# Patient Record
Sex: Female | Born: 1985 | Race: White | Hispanic: No | Smoking: Never smoker
Health system: Southern US, Community
[De-identification: ages and names within clinical notes are randomized; demographics above are authoritative.]

## PROBLEM LIST (undated history)

## (undated) DIAGNOSIS — J45909 Unspecified asthma, uncomplicated: Secondary | ICD-10-CM

## (undated) DIAGNOSIS — T7840XA Allergy, unspecified, initial encounter: Secondary | ICD-10-CM

## (undated) DIAGNOSIS — F329 Major depressive disorder, single episode, unspecified: Secondary | ICD-10-CM

## (undated) DIAGNOSIS — K219 Gastro-esophageal reflux disease without esophagitis: Secondary | ICD-10-CM

## (undated) DIAGNOSIS — F32A Depression, unspecified: Secondary | ICD-10-CM

## (undated) DIAGNOSIS — F419 Anxiety disorder, unspecified: Secondary | ICD-10-CM

## (undated) DIAGNOSIS — G43909 Migraine, unspecified, not intractable, without status migrainosus: Secondary | ICD-10-CM

## (undated) DIAGNOSIS — K5190 Ulcerative colitis, unspecified, without complications: Secondary | ICD-10-CM

## (undated) HISTORY — DX: Unspecified asthma, uncomplicated: J45.909

## (undated) HISTORY — DX: Ulcerative colitis, unspecified, without complications: K51.90

## (undated) HISTORY — PX: PARTIAL COLECTOMY: SHX5273

## (undated) HISTORY — DX: Allergy, unspecified, initial encounter: T78.40XA

## (undated) HISTORY — DX: Depression, unspecified: F32.A

## (undated) HISTORY — DX: Gastro-esophageal reflux disease without esophagitis: K21.9

## (undated) HISTORY — DX: Major depressive disorder, single episode, unspecified: F32.9

## (undated) HISTORY — DX: Anxiety disorder, unspecified: F41.9

## (undated) HISTORY — DX: Migraine, unspecified, not intractable, without status migrainosus: G43.909

---
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
## 2008-07-03 ENCOUNTER — Emergency Department (HOSPITAL_COMMUNITY): Admission: EM | Admit: 2008-07-03 | Discharge: 2008-07-04 | Payer: Self-pay | Admitting: Emergency Medicine

## 2009-11-12 ENCOUNTER — Emergency Department (HOSPITAL_COMMUNITY): Admission: EM | Admit: 2009-11-12 | Discharge: 2009-11-12 | Payer: Self-pay | Admitting: Emergency Medicine

## 2011-01-08 LAB — BASIC METABOLIC PANEL
BUN: 14 mg/dL (ref 6–23)
CO2: 17 mEq/L — ABNORMAL LOW (ref 19–32)
Calcium: 9.7 mg/dL (ref 8.4–10.5)
Chloride: 104 mEq/L (ref 96–112)
Creatinine, Ser: 1.07 mg/dL (ref 0.4–1.2)
GFR calc Af Amer: >60 mL/min (ref >60)
GFR calc non Af Amer: >60 mL/min (ref >60)
Glucose, Bld: 138 mg/dL — ABNORMAL HIGH (ref 70–99)
Potassium: 4.7 mEq/L (ref 3.5–5.1)
Sodium: 134 mEq/L — ABNORMAL LOW (ref 135–145)

## 2011-01-08 LAB — URINALYSIS, ROUTINE W REFLEX MICROSCOPIC
Bilirubin Urine: NEGATIVE
Glucose, UA: NEGATIVE mg/dL
Leukocytes, UA: NEGATIVE
Nitrite: NEGATIVE
Protein, ur: 100 mg/dL — AB
Specific Gravity, Urine: 1.031 — ABNORMAL HIGH (ref 1.005–1.030)
Urobilinogen, UA: 0.2 mg/dL (ref 0.0–1.0)
pH: 6 (ref 5.0–8.0)

## 2011-01-08 LAB — URINE MICROSCOPIC-ADD ON

## 2011-01-08 LAB — DIFFERENTIAL
Basophils Absolute: 0 10*3/uL (ref 0.0–0.1)
Basophils Relative: 0 % (ref 0–1)
Eosinophils Absolute: 0.2 10*3/uL (ref 0.0–0.7)
Eosinophils Relative: 2 % (ref 0–5)
Lymphocytes Relative: 13 % (ref 12–46)
Lymphs Abs: 1.5 10*3/uL (ref 0.7–4.0)
Monocytes Absolute: 0.8 10*3/uL (ref 0.1–1.0)
Monocytes Relative: 6 % (ref 3–12)
Neutro Abs: 9.2 10*3/uL — ABNORMAL HIGH (ref 1.7–7.7)
Neutrophils Relative %: 79 % — ABNORMAL HIGH (ref 43–77)

## 2011-01-08 LAB — CBC
HCT: 51.1 % — ABNORMAL HIGH (ref 36.0–46.0)
Hemoglobin: 17.3 g/dL — ABNORMAL HIGH (ref 12.0–15.0)
MCHC: 33.9 g/dL (ref 30.0–36.0)
MCV: 94.6 fL (ref 78.0–100.0)
Platelets: 195 10*3/uL (ref 150–400)
RBC: 5.4 MIL/uL — ABNORMAL HIGH (ref 3.87–5.11)
RDW: 12.9 % (ref 11.5–15.5)
WBC: 11.7 10*3/uL — ABNORMAL HIGH (ref 4.0–10.5)

## 2011-01-08 LAB — POCT PREGNANCY, URINE: Preg Test, Ur: NEGATIVE

## 2011-07-26 LAB — URINALYSIS, ROUTINE W REFLEX MICROSCOPIC
Glucose, UA: NEGATIVE
Ketones, ur: >80 — AB
Leukocytes, UA: NEGATIVE
Nitrite: NEGATIVE
Protein, ur: NEGATIVE
Specific Gravity, Urine: 1.035 — ABNORMAL HIGH
Urobilinogen, UA: 0.2
pH: 5.5

## 2011-07-26 LAB — COMPREHENSIVE METABOLIC PANEL
ALT: 30
AST: 26
Albumin: 4.5
Alkaline Phosphatase: 97
BUN: 10
CO2: 25
Calcium: 9.8
Chloride: 103
Creatinine, Ser: 0.87
GFR calc Af Amer: >60
GFR calc non Af Amer: >60
Glucose, Bld: 83
Potassium: 4.1
Sodium: 138
Total Bilirubin: 1.8 — ABNORMAL HIGH
Total Protein: 8

## 2011-07-26 LAB — DIFFERENTIAL
Basophils Absolute: 0.1
Basophils Relative: 1
Eosinophils Absolute: 0
Eosinophils Relative: 1
Lymphocytes Relative: 19
Lymphs Abs: 1.7
Monocytes Absolute: 0.6
Monocytes Relative: 7
Neutro Abs: 6.6
Neutrophils Relative %: 74

## 2011-07-26 LAB — RPR: RPR Ser Ql: NONREACTIVE

## 2011-07-26 LAB — URINE MICROSCOPIC-ADD ON

## 2011-07-26 LAB — CBC
HCT: 39.7
Hemoglobin: 13.6
MCHC: 34.1
MCV: 93
Platelets: 243
RBC: 4.27
RDW: 12.4
WBC: 8.9

## 2011-07-26 LAB — WET PREP, GENITAL
Clue Cells Wet Prep HPF POC: NONE SEEN
Trich, Wet Prep: NONE SEEN
Yeast Wet Prep HPF POC: NONE SEEN

## 2011-07-26 LAB — POCT PREGNANCY, URINE: Preg Test, Ur: NEGATIVE

## 2011-07-26 LAB — GC/CHLAMYDIA PROBE AMP, GENITAL
Chlamydia, DNA Probe: NEGATIVE
GC Probe Amp, Genital: NEGATIVE

## 2016-02-27 DIAGNOSIS — H52223 Regular astigmatism, bilateral: Secondary | ICD-10-CM | POA: Diagnosis not present

## 2017-01-29 ENCOUNTER — Ambulatory Visit (INDEPENDENT_AMBULATORY_CARE_PROVIDER_SITE_OTHER): Payer: BLUE CROSS/BLUE SHIELD | Admitting: Licensed Clinical Social Worker

## 2017-01-29 DIAGNOSIS — F321 Major depressive disorder, single episode, moderate: Secondary | ICD-10-CM | POA: Diagnosis not present

## 2017-02-18 DIAGNOSIS — J209 Acute bronchitis, unspecified: Secondary | ICD-10-CM | POA: Diagnosis not present

## 2017-02-18 DIAGNOSIS — R05 Cough: Secondary | ICD-10-CM | POA: Diagnosis not present

## 2017-02-18 DIAGNOSIS — R0989 Other specified symptoms and signs involving the circulatory and respiratory systems: Secondary | ICD-10-CM | POA: Diagnosis not present

## 2017-04-03 DIAGNOSIS — J4521 Mild intermittent asthma with (acute) exacerbation: Secondary | ICD-10-CM | POA: Diagnosis not present

## 2017-04-03 DIAGNOSIS — F419 Anxiety disorder, unspecified: Secondary | ICD-10-CM | POA: Diagnosis not present

## 2017-04-19 ENCOUNTER — Encounter: Payer: Self-pay | Admitting: Internal Medicine

## 2017-04-20 ENCOUNTER — Encounter: Payer: Self-pay | Admitting: Internal Medicine

## 2017-04-20 ENCOUNTER — Other Ambulatory Visit (INDEPENDENT_AMBULATORY_CARE_PROVIDER_SITE_OTHER): Payer: BLUE CROSS/BLUE SHIELD

## 2017-04-20 ENCOUNTER — Ambulatory Visit (INDEPENDENT_AMBULATORY_CARE_PROVIDER_SITE_OTHER): Payer: BLUE CROSS/BLUE SHIELD | Admitting: Internal Medicine

## 2017-04-20 VITALS — BP 126/66 | HR 84 | Ht 64.0 in | Wt 209.2 lb

## 2017-04-20 DIAGNOSIS — J45991 Cough variant asthma: Secondary | ICD-10-CM | POA: Diagnosis not present

## 2017-04-20 LAB — CBC WITH DIFFERENTIAL/PLATELET
Basophils Absolute: 0.1 10*3/uL (ref 0.0–0.1)
Basophils Relative: 1 % (ref 0.0–3.0)
Eosinophils Absolute: 0.4 10*3/uL (ref 0.0–0.7)
Eosinophils Relative: 4.4 % (ref 0.0–5.0)
HCT: 42.3 % (ref 36.0–46.0)
Hemoglobin: 14.4 g/dL (ref 12.0–15.0)
Lymphocytes Relative: 37.8 % (ref 12.0–46.0)
Lymphs Abs: 3.8 10*3/uL (ref 0.7–4.0)
MCHC: 34 g/dL (ref 30.0–36.0)
MCV: 88.5 fl (ref 78.0–100.0)
Monocytes Absolute: 0.6 10*3/uL (ref 0.1–1.0)
Monocytes Relative: 5.7 % (ref 3.0–12.0)
Neutro Abs: 5.1 10*3/uL (ref 1.4–7.7)
Neutrophils Relative %: 51.1 % (ref 43.0–77.0)
Platelets: 254 10*3/uL (ref 150.0–400.0)
RBC: 4.78 Mil/uL (ref 3.87–5.11)
RDW: 13.2 % (ref 11.5–15.5)
WBC: 10 10*3/uL (ref 4.0–10.5)

## 2017-04-20 MED ORDER — MOMETASONE FURO-FORMOTEROL FUM 100-5 MCG/ACT IN AERO: 2.0000 | INHALATION_SPRAY | Freq: Two times a day (BID) | RESPIRATORY_TRACT | 0 refills | Status: DC

## 2017-04-20 MED ORDER — FAMOTIDINE 20 MG PO TABS: ORAL_TABLET | ORAL | 2 refills | Status: DC

## 2017-04-20 MED ORDER — MOMETASONE FURO-FORMOTEROL FUM 100-5 MCG/ACT IN AERO: 2.0000 | INHALATION_SPRAY | Freq: Two times a day (BID) | RESPIRATORY_TRACT | 11 refills | Status: DC

## 2017-04-20 MED ORDER — PANTOPRAZOLE SODIUM 40 MG PO TBEC: 40.0000 mg | DELAYED_RELEASE_TABLET | Freq: Every day | ORAL | 2 refills | Status: DC

## 2017-04-20 MED ORDER — PREDNISONE 10 MG PO TABS: ORAL_TABLET | ORAL | 0 refills | Status: DC

## 2017-04-20 NOTE — Patient Instructions (Addendum)
Please remember to go to the lab  department downstairs in the basement  for your tests - we will call you with the results when they are available.  Stop flovent and try dulera 100 Take 2 puffs first thing in am and then another 2 puffs about 12 hours later.   Work on inhaler technique:  relax and gently blow all the way out then take a nice smooth deep breath back in, triggering the inhaler at same time you start breathing in.  Hold for up to 5 seconds if you can. Blow out thru nose. Rinse and gargle with water when done       Prednisone 10 mg take  4 each am x 2 days,   2 each am x 2 days,  1 each am x 2 days and stop    Pantoprazole (protonix) 40 mg   Take  30-60 min before first meal of the day and Pepcid (famotidine)  20 mg one @  bedtime until the cough is gone for a week.   GERD (REFLUX)  is an extremely common cause of respiratory symptoms just like yours , many times with no obvious heartburn at all.    It can be treated with medication, but also with lifestyle changes including elevation of the head of your bed (ideally with 6 inch  bed blocks),  Smoking cessation, avoidance of late meals, excessive alcohol, and avoid fatty foods, chocolate, peppermint, colas, red wine, and acidic juices such as orange juice.  NO MINT OR MENTHOL PRODUCTS SO NO COUGH DROPS   USE SUGARLESS CANDY INSTEAD (Jolley ranchers or Stover's or Life Savers) or even ice chips will also do - the key is to swallow to prevent all throat clearing. NO OIL BASED VITAMINS - use powdered substitutes.   Please schedule a follow up office visit in 6 weeks, call sooner if needed

## 2017-04-20 NOTE — Progress Notes (Signed)
Subjective:     Patient ID: Melissa Moon, female   DOB: 12/29/1985,    MRN: [id number]  HPI  61 yowf never smoker grew up in [place] with seasonal rhinitis as child just with pollen exp assoc with cough/wheeze and used various inhalers / nebs trending better as adult  until Feb 2018 with onset a little rhinitis and a lot of cough much better on prednisone and worse off it despite trying singulair/ flovent/proair. Previously fine with IUP 2013 to term. referred to pulmonary clinic 04/20/2017 by Marda Stalker, [place]   04/20/2017 1st [facility] office visit/ Mazikeen Hehn   Chief Complaint  Patient presents with  . Advice Only    Pt was referred by Marda Stalker from [facility] due to having a bad cough since February with mucus. Pt has tried using her proair and flovent inhalers, Z-Pak, prednisone, and singulair and nothing is helping. Pt also has some wheezing and SOB associated with cough.  acute onset cough > rhinitis since feb 2018 worse after supper and hs s excess/ purulent sputum or mucus plugs  Assoc with subj wheezing and sob with adls and only better p pred but worse again w/nin a week.   Onset was acute in Feb 2018 with early onset of pollen exp vs prev years   No obvious day to day or daytime variability or assoc   hemoptysis or cp or chest tightness,  or overt  hb symptoms. No unusual exp hx or h/o childhood pna/   or knowledge of premature birth.   Also denies any obvious fluctuation of symptoms with weather or environmental changes or other aggravating or alleviating factors except as outlined above   Current Medications, Allergies, Complete Past Medical History, Past Surgical History, Family History, and Social History were reviewed in [employer] record.  ROS  The following are not active complaints unless bolded sore throat, dysphagia, dental problems, itching, sneezing,  nasal congestion or excess/ purulent secretions, ear ache,   fever, chills,  sweats, unintended wt loss, classically pleuritic or exertional cp,  orthopnea pnd or leg swelling, presyncope, palpitations, abdominal pain, anorexia, nausea, vomiting, diarrhea  or change in bowel or bladder habits, change in stools or urine, dysuria,hematuria,  rash, arthralgias, visual complaints, headache, numbness, weakness or ataxia or problems with walking or coordination,  change in mood/affect or memory.           Review of Systems     Objective:   Physical Exam    amb pleasant mod  obese wf nad    Wt Readings from Last 3 Encounters:  04/20/17 209 lb 3.2 oz (94.9 kg)    Vital signs reviewed  - Note on arrival 02 sats  94% RA    HEENT: nl dentition,   and oropharynx. Nl external ear canals without cough reflex -  Moderately severe  bilateral non-specific turbinate edema     NECK :  without JVD/Nodes/TM/ nl carotid upstrokes bilaterally   LUNGS: no acc muscle use,  Nl contour chest which is clear to A and P bilaterally without cough on insp or exp maneuvers   CV:  RRR  no s3 or murmur or increase in P2, and no edema   ABD:  soft and nontender with nl inspiratory excursion in the supine position. No bruits or organomegaly appreciated, bowel sounds nl  MS:  Nl gait/ ext warm without deformities, calf tenderness, cyanosis or clubbing No obvious joint restrictions   SKIN: warm and dry without lesions  NEURO:  alert, approp, nl sensorium with  no motor or cerebellar deficits apparent.    I personally reviewed images and agree with radiology impression as follows:  CXR:   02/18/17  wnl     Assessment:

## 2017-04-21 NOTE — Assessment & Plan Note (Addendum)
DDX of  difficult airways management almost all start with A and  include Adherence, Ace Inhibitors, Acid Reflux, Active Sinus Disease, Alpha 1 Antitripsin deficiency, Anxiety masquerading as Airways dz,  ABPA,  Allergy(esp in young), Aspiration (esp in elderly), Adverse effects of meds,  Active smokers, A bunch of PE's (a small clot burden can't cause this syndrome unless there is already severe underlying pulm or vascular dz with poor reserve) plus two Bs  = Bronchiectasis and Beta blocker use..and one C= CHF   Adherence is always the initial "prime suspect" and is a multilayered concern that requires a "trust but verify" approach in every patient - starting with knowing how to use medications, especially inhalers, correctly, keeping up with refills and understanding the fundamental difference between maintenance and prns vs those medications only taken for a very short course and then stopped and not refilled.  - - The proper method of use, as well as anticipated side effects, of a metered-dose inhaler are discussed and demonstrated to the patient. Improved effectiveness after extensive coaching during this visit to a level of approximately 75 % from a baseline of 25 % > try dulera 100 2bid   ? Acid (or non-acid) GERD > always difficult to exclude as up to 75% of pts in some series report no assoc GI/ Heartburn symptoms> rec max (24h)  acid suppression and diet restrictions/ reviewed and instructions given in writing.   ? Allergy >  Send profile, continue singulair and rx again with pred in  Lowest dose that works = 6 days  ? Active sinus dz > consider sinus ct later  ? ABPA ? > send IgE level   ? Anxiety > usually at the bottom of this list of usual suspects but should be somwwhat higher on this pt's based on H and P and note already on psychotropics  > continue zoloft per pcp   ? Bronchiectasis > cough is not typical for bronchiectasis   ? Beta blocker effect > n/a   ? Chf > no evidence at  all    Total time devoted to counseling  > 50 % of initial 45 min office visit:  review case with pt/ discussion of options/alternatives/ personally creating written customized instructions  in presence of pt  then going over those specific  Instructions directly with the pt including how to use all of the meds but in particular covering each new medication in detail and the difference between the maintenance= "automatic" meds and the prns using an action plan format for the latter (If this problem/symptom => do that organization reading Left to right).  Please see AVS from this visit for a full list of these instructions which I personally wrote for this pt and  are unique to this visit.

## 2017-04-21 NOTE — Assessment & Plan Note (Signed)
Body mass index is 35.91 kg/m.   No results found for: TSH   Contributing to gerd risk/ doe/reviewed the need and the process to achieve and maintain neg calorie balance > defer f/u primary care including intermittently monitoring thyroid status

## 2017-04-21 NOTE — Addendum Note (Signed)
Addended by: Christinia Gully B on: 04/21/2017 06:01 AM   Modules accepted: Level of Service

## 2017-04-23 LAB — RESPIRATORY ALLERGY PROFILE REGION II ~~LOC~~
Allergen, A. alternata, m6: <0.1 kU/L
Allergen, C. Herbarum, M2: <0.1 kU/L
Allergen, Cedar tree, t12: <0.1 kU/L
Allergen, Comm Silver Birch, t9: 1.32 kU/L — ABNORMAL HIGH
Allergen, Cottonwood, t14: <0.1 kU/L
Allergen, D pternoyssinus,d7: <0.1 kU/L
Allergen, Mouse Urine Protein, e78: <0.1 kU/L
Allergen, Mulberry, t76: <0.1 kU/L
Allergen, Oak,t7: 0.28 kU/L — ABNORMAL HIGH
Allergen, P. notatum, m1: <0.1 kU/L
Aspergillus fumigatus, m3: <0.1 kU/L
Bermuda Grass: 0.3 kU/L — ABNORMAL HIGH
Box Elder IgE: <0.1 kU/L
Cat Dander: <0.1 kU/L
Cockroach: <0.1 kU/L
Common Ragweed: 3.14 kU/L — ABNORMAL HIGH
D. farinae: <0.1 kU/L
Dog Dander: <0.1 kU/L
Elm IgE: 0.5 kU/L — ABNORMAL HIGH
IgE (Immunoglobulin E), Serum: 46 kU/L (ref <115)
Johnson Grass: 0.29 kU/L — ABNORMAL HIGH
Pecan/Hickory Tree IgE: 6.8 kU/L — ABNORMAL HIGH
Rough Pigweed  IgE: 0.15 kU/L — ABNORMAL HIGH
Sheep Sorrel IgE: <0.1 kU/L
Timothy Grass: 1.09 kU/L — ABNORMAL HIGH

## 2017-04-24 NOTE — Progress Notes (Signed)
ATC, NA and no option to leave msg 

## 2017-05-24 ENCOUNTER — Encounter: Payer: Self-pay | Admitting: Internal Medicine

## 2017-06-01 ENCOUNTER — Ambulatory Visit: Payer: Self-pay | Admitting: Internal Medicine

## 2018-01-06 DIAGNOSIS — J45901 Unspecified asthma with (acute) exacerbation: Secondary | ICD-10-CM | POA: Diagnosis not present

## 2018-01-06 DIAGNOSIS — Z8709 Personal history of other diseases of the respiratory system: Secondary | ICD-10-CM | POA: Diagnosis not present

## 2018-01-06 DIAGNOSIS — J209 Acute bronchitis, unspecified: Secondary | ICD-10-CM | POA: Diagnosis not present

## 2018-01-06 DIAGNOSIS — R062 Wheezing: Secondary | ICD-10-CM | POA: Diagnosis not present

## 2018-01-20 DIAGNOSIS — J4521 Mild intermittent asthma with (acute) exacerbation: Secondary | ICD-10-CM | POA: Diagnosis not present

## 2018-01-20 DIAGNOSIS — Z8709 Personal history of other diseases of the respiratory system: Secondary | ICD-10-CM | POA: Diagnosis not present

## 2018-01-20 DIAGNOSIS — Z889 Allergy status to unspecified drugs, medicaments and biological substances status: Secondary | ICD-10-CM | POA: Diagnosis not present

## 2018-02-14 DIAGNOSIS — M5412 Radiculopathy, cervical region: Secondary | ICD-10-CM | POA: Diagnosis not present

## 2018-02-14 DIAGNOSIS — M530 Cervicocranial syndrome: Secondary | ICD-10-CM | POA: Diagnosis not present

## 2018-02-14 DIAGNOSIS — M531 Cervicobrachial syndrome: Secondary | ICD-10-CM | POA: Diagnosis not present

## 2018-02-14 DIAGNOSIS — M9901 Segmental and somatic dysfunction of cervical region: Secondary | ICD-10-CM | POA: Diagnosis not present

## 2018-02-25 ENCOUNTER — Observation Stay (HOSPITAL_COMMUNITY)
Admission: EM | Admit: 2018-02-25 | Discharge: 2018-02-26 | Disposition: A | Discharge status: Home / Self Care | Payer: BLUE CROSS/BLUE SHIELD | Attending: Internal Medicine | Admitting: Internal Medicine

## 2018-02-25 ENCOUNTER — Emergency Department (HOSPITAL_COMMUNITY): Payer: BLUE CROSS/BLUE SHIELD

## 2018-02-25 ENCOUNTER — Encounter (HOSPITAL_COMMUNITY): Payer: Self-pay | Admitting: *Deleted

## 2018-02-25 ENCOUNTER — Other Ambulatory Visit: Payer: Self-pay

## 2018-02-25 DIAGNOSIS — K5190 Ulcerative colitis, unspecified, without complications: Secondary | ICD-10-CM | POA: Diagnosis not present

## 2018-02-25 DIAGNOSIS — Z8379 Family history of other diseases of the digestive system: Secondary | ICD-10-CM | POA: Insufficient documentation

## 2018-02-25 DIAGNOSIS — Z800 Family history of malignant neoplasm of digestive organs: Secondary | ICD-10-CM | POA: Diagnosis not present

## 2018-02-25 DIAGNOSIS — J4531 Mild persistent asthma with (acute) exacerbation: Secondary | ICD-10-CM | POA: Diagnosis not present

## 2018-02-25 DIAGNOSIS — Z6834 Body mass index (BMI) 34.0-34.9, adult: Secondary | ICD-10-CM | POA: Diagnosis not present

## 2018-02-25 DIAGNOSIS — D72829 Elevated white blood cell count, unspecified: Secondary | ICD-10-CM | POA: Diagnosis not present

## 2018-02-25 DIAGNOSIS — J4550 Severe persistent asthma, uncomplicated: Secondary | ICD-10-CM

## 2018-02-25 DIAGNOSIS — J4521 Mild intermittent asthma with (acute) exacerbation: Secondary | ICD-10-CM

## 2018-02-25 DIAGNOSIS — F329 Major depressive disorder, single episode, unspecified: Secondary | ICD-10-CM | POA: Diagnosis not present

## 2018-02-25 DIAGNOSIS — R000 Tachycardia, unspecified: Secondary | ICD-10-CM | POA: Insufficient documentation

## 2018-02-25 DIAGNOSIS — Z79899 Other long term (current) drug therapy: Secondary | ICD-10-CM | POA: Insufficient documentation

## 2018-02-25 DIAGNOSIS — F419 Anxiety disorder, unspecified: Secondary | ICD-10-CM | POA: Insufficient documentation

## 2018-02-25 DIAGNOSIS — J45901 Unspecified asthma with (acute) exacerbation: Principal | ICD-10-CM | POA: Insufficient documentation

## 2018-02-25 DIAGNOSIS — R0602 Shortness of breath: Secondary | ICD-10-CM | POA: Diagnosis present

## 2018-02-25 DIAGNOSIS — Z833 Family history of diabetes mellitus: Secondary | ICD-10-CM | POA: Diagnosis not present

## 2018-02-25 DIAGNOSIS — Z825 Family history of asthma and other chronic lower respiratory diseases: Secondary | ICD-10-CM | POA: Diagnosis not present

## 2018-02-25 DIAGNOSIS — Z7951 Long term (current) use of inhaled steroids: Secondary | ICD-10-CM | POA: Insufficient documentation

## 2018-02-25 LAB — CBC WITH DIFFERENTIAL/PLATELET
Basophils Absolute: 0.1 10*3/uL (ref 0.0–0.1)
Basophils Relative: 1 %
Eosinophils Absolute: 1.1 10*3/uL — ABNORMAL HIGH (ref 0.0–0.7)
Eosinophils Relative: 9 %
HCT: 45.3 % (ref 36.0–46.0)
Hemoglobin: 15.4 g/dL — ABNORMAL HIGH (ref 12.0–15.0)
Lymphocytes Relative: 33 %
Lymphs Abs: 4.1 10*3/uL — ABNORMAL HIGH (ref 0.7–4.0)
MCH: 30.3 pg (ref 26.0–34.0)
MCHC: 34 g/dL (ref 30.0–36.0)
MCV: 89.2 fL (ref 78.0–100.0)
Monocytes Absolute: 0.6 10*3/uL (ref 0.1–1.0)
Monocytes Relative: 5 %
Neutro Abs: 6.6 10*3/uL (ref 1.7–7.7)
Neutrophils Relative %: 52 %
Platelets: 305 10*3/uL (ref 150–400)
RBC: 5.08 MIL/uL (ref 3.87–5.11)
RDW: 12.6 % (ref 11.5–15.5)
WBC: 12.5 10*3/uL — ABNORMAL HIGH (ref 4.0–10.5)

## 2018-02-25 LAB — I-STAT CHEM 8, ED
BUN: 7 mg/dL (ref 6–20)
Calcium, Ion: 0.93 mmol/L — ABNORMAL LOW (ref 1.15–1.40)
Chloride: 107 mmol/L (ref 101–111)
Creatinine, Ser: 0.8 mg/dL (ref 0.44–1.00)
Glucose, Bld: 178 mg/dL — ABNORMAL HIGH (ref 65–99)
HCT: 47 % — ABNORMAL HIGH (ref 36.0–46.0)
Hemoglobin: 16 g/dL — ABNORMAL HIGH (ref 12.0–15.0)
Potassium: 4.1 mmol/L (ref 3.5–5.1)
Sodium: 136 mmol/L (ref 135–145)
TCO2: 22 mmol/L (ref 22–32)

## 2018-02-25 LAB — CREATININE, SERUM
Creatinine, Ser: 1.07 mg/dL — ABNORMAL HIGH (ref 0.44–1.00)
GFR calc Af Amer: >60 mL/min (ref >60)
GFR calc non Af Amer: >60 mL/min (ref >60)

## 2018-02-25 LAB — CBC
HCT: 46 % (ref 36.0–46.0)
Hemoglobin: 15.4 g/dL — ABNORMAL HIGH (ref 12.0–15.0)
MCH: 30 pg (ref 26.0–34.0)
MCHC: 33.5 g/dL (ref 30.0–36.0)
MCV: 89.7 fL (ref 78.0–100.0)
Platelets: 308 10*3/uL (ref 150–400)
RBC: 5.13 MIL/uL — ABNORMAL HIGH (ref 3.87–5.11)
RDW: 12.8 % (ref 11.5–15.5)
WBC: 12.6 10*3/uL — ABNORMAL HIGH (ref 4.0–10.5)

## 2018-02-25 LAB — MAGNESIUM: Magnesium: 2.3 mg/dL (ref 1.7–2.4)

## 2018-02-25 LAB — I-STAT BETA HCG BLOOD, ED (MC, WL, AP ONLY): I-stat hCG, quantitative: 17.2 m[IU]/mL — ABNORMAL HIGH (ref <5)

## 2018-02-25 LAB — HCG, QUANTITATIVE, PREGNANCY: hCG, Beta Chain, Quant, S: <1 m[IU]/mL (ref <5)

## 2018-02-25 LAB — D-DIMER, QUANTITATIVE: D-Dimer, Quant: 0.3 ug/mL-FEU (ref 0.00–0.50)

## 2018-02-25 LAB — TSH: TSH: 0.383 u[IU]/mL (ref 0.350–4.500)

## 2018-02-25 MED ORDER — METHYLPREDNISOLONE SODIUM SUCC 125 MG IJ SOLR
125.0000 mg | Freq: Once | INTRAMUSCULAR | Status: AC
  Administered 2018-02-25: 125 mg via INTRAVENOUS
  Filled 2018-02-25: qty 2

## 2018-02-25 MED ORDER — LORATADINE 10 MG PO TABS
10.0000 mg | ORAL_TABLET | Freq: Once | ORAL | Status: AC
  Administered 2018-02-25: 10 mg via ORAL
  Filled 2018-02-25: qty 1

## 2018-02-25 MED ORDER — ALBUTEROL (5 MG/ML) CONTINUOUS INHALATION SOLN
10.0000 mg/h | INHALATION_SOLUTION | RESPIRATORY_TRACT | Status: DC
  Administered 2018-02-25: 10 mg/h via RESPIRATORY_TRACT
  Filled 2018-02-25: qty 20

## 2018-02-25 MED ORDER — METHYLPREDNISOLONE SODIUM SUCC 125 MG IJ SOLR
60.0000 mg | Freq: Three times a day (TID) | INTRAMUSCULAR | Status: DC
  Administered 2018-02-25 – 2018-02-26 (×4): 60 mg via INTRAVENOUS
  Filled 2018-02-25 (×4): qty 2

## 2018-02-25 MED ORDER — LEVALBUTEROL HCL 0.63 MG/3ML IN NEBU: 0.6300 mg | INHALATION_SOLUTION | Freq: Four times a day (QID) | RESPIRATORY_TRACT | Status: DC | PRN

## 2018-02-25 MED ORDER — FAMOTIDINE 20 MG PO TABS
10.0000 mg | ORAL_TABLET | Freq: Every day | ORAL | Status: DC
  Administered 2018-02-25 – 2018-02-26 (×2): 10 mg via ORAL
  Filled 2018-02-25 (×2): qty 1

## 2018-02-25 MED ORDER — ACETAMINOPHEN 325 MG PO TABS
650.0000 mg | ORAL_TABLET | Freq: Four times a day (QID) | ORAL | Status: DC | PRN
  Administered 2018-02-25 (×2): 650 mg via ORAL
  Filled 2018-02-25 (×2): qty 2

## 2018-02-25 MED ORDER — ENOXAPARIN SODIUM 40 MG/0.4ML ~~LOC~~ SOLN
40.0000 mg | SUBCUTANEOUS | Status: DC
  Administered 2018-02-25: 40 mg via SUBCUTANEOUS
  Filled 2018-02-25 (×2): qty 0.4

## 2018-02-25 MED ORDER — ALBUTEROL SULFATE (2.5 MG/3ML) 0.083% IN NEBU
5.0000 mg | INHALATION_SOLUTION | Freq: Once | RESPIRATORY_TRACT | Status: AC
  Administered 2018-02-25: 5 mg via RESPIRATORY_TRACT
  Filled 2018-02-25: qty 6

## 2018-02-25 MED ORDER — MOMETASONE FURO-FORMOTEROL FUM 100-5 MCG/ACT IN AERO
2.0000 | INHALATION_SPRAY | Freq: Two times a day (BID) | RESPIRATORY_TRACT | Status: DC
  Filled 2018-02-25: qty 8.8

## 2018-02-25 MED ORDER — ACETAMINOPHEN 650 MG RE SUPP: 650.0000 mg | Freq: Four times a day (QID) | RECTAL | Status: DC | PRN

## 2018-02-25 MED ORDER — GUAIFENESIN ER 600 MG PO TB12
600.0000 mg | ORAL_TABLET | Freq: Two times a day (BID) | ORAL | Status: DC
  Administered 2018-02-25 – 2018-02-26 (×3): 600 mg via ORAL
  Filled 2018-02-25 (×3): qty 1

## 2018-02-25 MED ORDER — MAGNESIUM SULFATE 2 GM/50ML IV SOLN
2.0000 g | Freq: Once | INTRAVENOUS | Status: AC
  Administered 2018-02-25: 2 g via INTRAVENOUS
  Filled 2018-02-25: qty 50

## 2018-02-25 MED ORDER — MOMETASONE FURO-FORMOTEROL FUM 100-5 MCG/ACT IN AERO
2.0000 | INHALATION_SPRAY | Freq: Two times a day (BID) | RESPIRATORY_TRACT | Status: DC
  Administered 2018-02-26: 2 via RESPIRATORY_TRACT
  Filled 2018-02-25: qty 8.8

## 2018-02-25 NOTE — H&P (Addendum)
[facility] History and Physical  Jaquisha Frech TKP:[id number] DOB: 1986/04/29 DOA: 02/25/2018  Referring physician:  PCP: Patient, No Pcp Per   Chief Complaint: shortness of breath   HPI:  32 year old female with a history of seasonal rhinitis,morbid obesity with a BMI of 35.9,asthma followed by Dr. Melvyn Novas, who presents to the ED today with gradual worsening  And difficulty breathing 3 months. She has experienced symptoms of wheezing, dyspnea on exertion, for the last 3-4 days, to the point that she could not catch a breath . She only uses albuterol and does not use a LABA ,or steroid inhaler . She is in process of finding a PCP. Marland KitchenShe has seen pulmonary but recently switched jobs and has not seen a provider recently . She was found to have  asthma exacerbation and was treated as following  ED course  BP (!) 145/85   Pulse (!) 116   Resp 20   Ht 5' 5"  (1.651 m)   LMP 02/04/2018 (Approximate)   SpO2 92%   BMI 34.81 kg/m  initally  89%on RA  chest x-ray shows central bronchitis markings, representing reactive airway disease Patient was treated with continuous nebs, IV Solu-Medrol 125 mg, IV magnesium, Claritin She is being admitted for asthma exacerbation.     Review of Systems: negative for the following  Constitutional: Denies fever, chills, diaphoresis, appetite change and fatigue.  HEENT: Denies photophobia, eye pain, redness, hearing loss, ear pain, congestion, sore throat, rhinorrhea, sneezing, mouth sores, trouble swallowing, neck pain, neck stiffness and tinnitus.  Respiratory: Positive for shortness of breath and wheezing. Negative for cough, hemoptysis and sputum production.   Cardiovascular: Denies chest pain, palpitations and leg swelling.  Gastrointestinal: Denies nausea, vomiting, abdominal pain, diarrhea, constipation, blood in stool and abdominal distention.  Genitourinary: Denies dysuria, urgency, frequency, hematuria, flank pain and difficulty urinating.   Musculoskeletal: Denies myalgias, back pain, joint swelling, arthralgias and gait problem.  Skin: Denies pallor, rash and wound.  Neurological: Denies dizziness, seizures, syncope, weakness, light-headedness, numbness and headaches.  Hematological: Denies adenopathy. Easy bruising, personal or family bleeding history  Psychiatric/Behavioral: Denies suicidal ideation, mood changes, confusion, nervousness, sleep disturbance and agitation       Past Medical History:  Diagnosis Date  . Anxiety and depression   . Asthma   . UC (ulcerative colitis) ([place])      History reviewed. No pertinent surgical history.    Social History:  reports that she has never smoked. She has never used smokeless tobacco. She reports that she does not drink alcohol or use drugs.    Allergies  Allergen Reactions  . Cefaclor Other (See Comments)    Happened as a child   . Tacrolimus Other (See Comments)    Happened as a child. unknown    Family History  Problem Relation Age of Onset  . Pancreatic cancer Mother        Deceased at age 76  . Diabetes Mother   . Liver disease Father   . COPD Father         Prior to Admission medications   Medication Sig Start Date End Date Taking? Authorizing Provider  albuterol (PROVENTIL HFA;VENTOLIN HFA) 108 (90 Base) MCG/ACT inhaler Inhale 2 puffs into the lungs every 6 (six) hours as needed for wheezing or shortness of breath.   Yes [provider]  pseudoephedrine (SUDAFED) 120 MG 12 hr tablet Take 120 mg by mouth every 12 (twelve) hours as needed for congestion.   Yes [provider]  famotidine (PEPCID) 20 MG tablet One at bedtime Patient not taking: Reported on 02/25/2018 04/20/17   Tanda Rockers, MD  mometasone-formoterol (DULERA) 100-5 MCG/ACT AERO Inhale 2 puffs into the lungs 2 (two) times daily. Patient not taking: Reported on 02/25/2018 04/20/17   Tanda Rockers, MD     Physical Exam: Vitals:   02/25/18 0415 02/25/18 0500 02/25/18  0600 02/25/18 0703  BP:  (!) 154/91 127/85 (!) 144/77  Pulse: (!) 120 (!) 107  (!) 135  Resp:    20  TempSrc:      SpO2: 93% (!) 89%  92%  Height:            Vitals:   02/25/18 0415 02/25/18 0500 02/25/18 0600 02/25/18 0703  BP:  (!) 154/91 127/85 (!) 144/77  Pulse: (!) 120 (!) 107  (!) 135  Resp:    20  TempSrc:      SpO2: 93% (!) 89%  92%  Height:       Constitutional: NAD, calm, comfortable Eyes: PERRL, lids and conjunctivae normal ENMT: Mucous membranes are moist. Posterior pharynx clear of any exudate or lesions.Normal dentition.  Neck: normal, supple, no masses, no thyromegaly Pulmonary/Chest: Tachypnea noted. She is in respiratory distress. She has wheezes.   Cardiovascular: Regular rate and rhythm, no murmurs / rubs / gallops. No extremity edema. 2+ pedal pulses. No carotid bruits.  Abdomen: no tenderness, no masses palpated. No hepatosplenomegaly. Bowel sounds positive.  Musculoskeletal: no clubbing / cyanosis. No joint deformity upper and lower extremities. Good ROM, no contractures. Normal muscle tone.  Skin: no rashes, lesions, ulcers. No induration Neurologic: CN 2-12 grossly intact. Sensation intact, DTR normal. Strength 5/5 in all 4.  Psychiatric: Normal judgment and insight. Alert and oriented x 3. Normal mood.     Labs on Admission: I have personally reviewed following labs and imaging studies  CBC: Recent Labs  Lab 02/25/18 0320 02/25/18 0359  WBC 12.5*  --   NEUTROABS 6.6  --   HGB 15.4* 16.0*  HCT 45.3 47.0*  MCV 89.2  --   PLT 305  --     Basic Metabolic Panel: Recent Labs  Lab 02/25/18 0359  NA 136  K 4.1  CL 107  GLUCOSE 178*  BUN 7  CREATININE 0.80    GFR: CrCl cannot be calculated (Unknown ideal weight.).  Liver Function Tests: No results for input(s): AST, ALT, ALKPHOS, BILITOT, PROT, ALBUMIN in the last 168 hours. No results for input(s): LIPASE, AMYLASE in the last 168 hours. No results for input(s): AMMONIA in the last  168 hours.  Coagulation Profile: No results for input(s): INR, PROTIME in the last 168 hours. Recent Labs    02/25/18 0404  DDIMER 0.30    Cardiac Enzymes: No results for input(s): CKTOTAL, CKMB, CKMBINDEX, TROPONINI in the last 168 hours.  BNP (last 3 results) No results for input(s): PROBNP in the last 8760 hours.  HbA1C: No results for input(s): HGBA1C in the last 72 hours. No results found for: HGBA1C   CBG: No results for input(s): GLUCAP in the last 168 hours.  Lipid Profile: No results for input(s): CHOL, HDL, LDLCALC, TRIG, CHOLHDL, LDLDIRECT in the last 72 hours.  Thyroid Function Tests: No results for input(s): TSH, T4TOTAL, FREET4, T3FREE, THYROIDAB in the last 72 hours.  Anemia Panel: No results for input(s): VITAMINB12, FOLATE, FERRITIN, TIBC, IRON, RETICCTPCT in the last 72 hours.  Urine analysis:    Component Value Date/Time   COLORURINE AMBER BIOCHEMICALS MAY  BE AFFECTED BY COLOR (A) 11/12/2009 0534   APPEARANCEUR CLOUDY (A) 11/12/2009 0534   LABSPEC 1.031 (H) 11/12/2009 0534   PHURINE 6.0 11/12/2009 0534   GLUCOSEU NEGATIVE 11/12/2009 0534   HGBUR SMALL (A) 11/12/2009 0534   BILIRUBINUR NEGATIVE 11/12/2009 0534   KETONESUR TRACE (A) 11/12/2009 0534   PROTEINUR 100 (A) 11/12/2009 0534   UROBILINOGEN 0.2 11/12/2009 0534   NITRITE NEGATIVE 11/12/2009 0534   LEUKOCYTESUR NEGATIVE 11/12/2009 0534    Sepsis Labs: @LABRCNTIP (procalcitonin:4,lacticidven:4) )No results found for this or any previous visit (from the past 240 hour(s)).       Radiological Exams on Admission: Dg Chest 2 View  Result Date: 02/25/2018 CLINICAL DATA:  32 y/o F; shortness of breath, wheezing, history of asthma. EXAM: CHEST - 2 VIEW COMPARISON:  11/12/2009 chest radiograph FINDINGS: Stable normal cardiac silhouette given projection and technique. Central bronchitic markings. No focal consolidation. No pleural effusion or pneumothorax. Bones are unremarkable. IMPRESSION:  Central bronchitic markings may represent bronchitis or reactive airways disease. No consolidation. Electronically Signed   By: Kristine Garbe M.D.   On: 02/25/2018 03:40   Dg Chest 2 View  Result Date: 02/25/2018 CLINICAL DATA:  32 y/o F; shortness of breath, wheezing, history of asthma. EXAM: CHEST - 2 VIEW COMPARISON:  11/12/2009 chest radiograph FINDINGS: Stable normal cardiac silhouette given projection and technique. Central bronchitic markings. No focal consolidation. No pleural effusion or pneumothorax. Bones are unremarkable. IMPRESSION: Central bronchitic markings may represent bronchitis or reactive airways disease. No consolidation. Electronically Signed   By: Kristine Garbe M.D.   On: 02/25/2018 03:40      EKG: Independently reviewed.   Assessment/Plan   Acute asthma exacerbation moderate to severe Initially noted to be hypoxic at 89% on room air, patient declined ABG after treatment her oxygen saturation at rest is 91-92% Patient is tachycardic but no concern for PE, d-dimer negative Patient will be treated with Xopenex, will start long-acting inhaled steroid/LABA ,started on  Dulera assess oxygen needs, wean oxygen as tolerated Will continue IV Solu-Medrol Will need to follow up with Dr Melvyn Novas  Outpatient PFT's   Sinus tachycradia Due to albuterol nebulizer treatment Switch to Xopenex  monitor on telemetry      Morbid obesity due to excess calories ([place]) Body mass index is 34.81 kg/m.     DVT prophylaxis:  lovenox   consults called:  Family Communication: Admission, patients condition and plan of care including tests being ordered have been discussed with the patient  who indicates understanding and agree with the plan and Code Status  Admission status: inpatient   Disposition plan: Further plan will depend as patient's clinical course evolves and further radiologic and laboratory data become available. Likely home when stable     Reyne Dumas MD [facility] Pager [phone]  If 7PM-7AM, please contact night-coverage [email or web address] Password [email or web address]  02/25/2018, 8:07 AM

## 2018-02-25 NOTE — ED Provider Notes (Signed)
[place] DEPT Provider Note   CSN: [id number] Arrival date & time: 02/25/18  0224     History   Chief Complaint Chief Complaint  Patient presents with  . Shortness of Breath  . Wheezing    HPI Melissa Moon is a 32 y.o. female.  The history is provided by the patient.  Shortness of Breath  This is a recurrent problem. The average episode lasts 1 month. The problem occurs continuously.The current episode started more than 1 week ago. The problem has been gradually worsening. Associated symptoms include wheezing. Pertinent negatives include no fever, no headaches, no coryza, no rhinorrhea, no sore throat, no swollen glands, no ear pain, no neck pain, no cough, no sputum production, no hemoptysis, no PND, no orthopnea, no chest pain, no syncope, no vomiting, no abdominal pain, no rash, no leg pain, no leg swelling and no claudication. She has tried beta-agonist inhalers for the symptoms. The treatment provided no relief. Associated medical issues include asthma.  Wheezing   Pertinent negatives include no chest pain, no fever, no abdominal pain, no vomiting, no coryza, no ear pain, no headaches, no rhinorrhea, no sore throat, no swollen glands, no neck pain, no cough, no hemoptysis, no sputum production and no rash. Her past medical history is significant for asthma.    Past Medical History:  Diagnosis Date  . Anxiety and depression   . Asthma   . UC (ulcerative colitis) [facility])     Patient Active Problem List   Diagnosis Date Noted  . Morbid obesity due to excess calories ([place]) 04/21/2017  . Cough variant asthma 04/20/2017    History reviewed. No pertinent surgical history.   OB History   None      Home Medications    Prior to Admission medications   Medication Sig Start Date End Date Taking? Authorizing Provider  albuterol (PROVENTIL HFA;VENTOLIN HFA) 108 (90 Base) MCG/ACT inhaler Inhale 2 puffs into the lungs every 6 (six) hours as  needed for wheezing or shortness of breath.    [provider]  famotidine (PEPCID) 20 MG tablet One at bedtime 04/20/17   Tanda Rockers, MD  mometasone-formoterol (DULERA) 100-5 MCG/ACT AERO Inhale 2 puffs into the lungs 2 (two) times daily. 04/20/17   Tanda Rockers, MD  montelukast (SINGULAIR) 10 MG tablet Take 10 mg by mouth at bedtime.    [provider]  pantoprazole (PROTONIX) 40 MG tablet Take 1 tablet (40 mg total) by mouth daily. Take 30-60 min before first meal of the day 04/20/17   Tanda Rockers, MD  predniSONE (DELTASONE) 10 MG tablet Take  4 each am x 2 days,   2 each am x 2 days,  1 each am x 2 days and stop 04/20/17   Tanda Rockers, MD  sertraline (ZOLOFT) 50 MG tablet Take 50 mg by mouth at bedtime.    [provider]    Family History Family History  Problem Relation Age of Onset  . Pancreatic cancer Mother        Deceased at age 62  . Diabetes Mother   . Liver disease Father   . COPD Father     Social History Social History   Tobacco Use  . Smoking status: Never Smoker  . Smokeless tobacco: Never Used  Substance Use Topics  . Alcohol use: No  . Drug use: No     Allergies   Cefaclor and Tacrolimus   Review of Systems Review of Systems  Constitutional: Negative for fever.  HENT: Negative for ear pain, rhinorrhea and sore throat.   Respiratory: Positive for shortness of breath and wheezing. Negative for cough, hemoptysis and sputum production.   Cardiovascular: Negative for chest pain, orthopnea, claudication, leg swelling, syncope and PND.  Gastrointestinal: Negative for abdominal pain and vomiting.  Genitourinary: Negative for decreased urine volume, flank pain and vaginal bleeding.  Musculoskeletal: Negative for neck pain.  Skin: Negative for rash.  Neurological: Negative for headaches.  All other systems reviewed and are negative.    Physical Exam Updated Vital Signs BP (!) 145/85   Pulse (!) 116   Resp 20   Ht  5' 5"  (1.651 m)   LMP 02/04/2018 (Approximate)   SpO2 92%   BMI 34.81 kg/m   Physical Exam  Constitutional: She is oriented to person, place, and time. She appears well-developed and well-nourished. She appears distressed.  HENT:  Head: Normocephalic and atraumatic.  Mouth/Throat: No oropharyngeal exudate.  Eyes: Pupils are equal, round, and reactive to light. Conjunctivae are normal.  Neck: Normal range of motion. Neck supple.  Cardiovascular: Normal rate, regular rhythm, normal heart sounds and intact distal pulses.  Pulmonary/Chest: Tachypnea noted. She is in respiratory distress. She has wheezes.  Abdominal: Soft. Bowel sounds are normal. She exhibits no mass. There is no tenderness. There is no rebound and no guarding.  Musculoskeletal: Normal range of motion.  Neurological: She is alert and oriented to person, place, and time. She displays normal reflexes.  Skin: Skin is warm and dry. Capillary refill takes less than 2 seconds.  Psychiatric: She has a normal mood and affect.     ED Treatments / Results  Labs (all labs ordered are listed, but only abnormal results are displayed) Results for orders placed or performed during the hospital encounter of 02/25/18  CBC with Differential/Platelet  Result Value Ref Range   WBC 12.5 (H) 4.0 - 10.5 K/uL   RBC 5.08 3.87 - 5.11 MIL/uL   Hemoglobin 15.4 (H) 12.0 - 15.0 g/dL   HCT 45.3 36.0 - 46.0 %   MCV 89.2 78.0 - 100.0 fL   MCH 30.3 26.0 - 34.0 pg   MCHC 34.0 30.0 - 36.0 g/dL   RDW 12.6 11.5 - 15.5 %   Platelets 305 150 - 400 K/uL   Neutrophils Relative % 52 %   Neutro Abs 6.6 1.7 - 7.7 K/uL   Lymphocytes Relative 33 %   Lymphs Abs 4.1 (H) 0.7 - 4.0 K/uL   Monocytes Relative 5 %   Monocytes Absolute 0.6 0.1 - 1.0 K/uL   Eosinophils Relative 9 %   Eosinophils Absolute 1.1 (H) 0.0 - 0.7 K/uL   Basophils Relative 1 %   Basophils Absolute 0.1 0.0 - 0.1 K/uL  D-dimer, quantitative (not at [facility])  Result Value Ref Range    D-Dimer, Quant 0.30 0.00 - 0.50 ug/mL-FEU  hCG, quantitative, pregnancy  Result Value Ref Range   hCG, Beta Chain, Quant, S <1 <5 mIU/mL  I-stat chem 8, ed  Result Value Ref Range   Sodium 136 135 - 145 mmol/L   Potassium 4.1 3.5 - 5.1 mmol/L   Chloride 107 101 - 111 mmol/L   BUN 7 6 - 20 mg/dL   Creatinine, Ser 0.80 0.44 - 1.00 mg/dL   Glucose, Bld 178 (H) 65 - 99 mg/dL   Calcium, Ion 0.93 (L) 1.15 - 1.40 mmol/L   TCO2 22 22 - 32 mmol/L   Hemoglobin 16.0 (H) 12.0 - 15.0 g/dL  HCT 47.0 (H) 36.0 - 46.0 %  I-Stat Beta hCG blood, ED ([facility], [facility], [facility] only)  Result Value Ref Range   I-stat hCG, quantitative 17.2 (H) <5 mIU/mL   Comment 3           Dg Chest 2 View  Result Date: 02/25/2018 CLINICAL DATA:  32 y/o F; shortness of breath, wheezing, history of asthma. EXAM: CHEST - 2 VIEW COMPARISON:  11/12/2009 chest radiograph FINDINGS: Stable normal cardiac silhouette given projection and technique. Central bronchitic markings. No focal consolidation. No pleural effusion or pneumothorax. Bones are unremarkable. IMPRESSION: Central bronchitic markings may represent bronchitis or reactive airways disease. No consolidation. Electronically Signed   By: Kristine Garbe M.D.   On: 02/25/2018 03:40      Procedures Procedures (including critical care time)  Medications Ordered in ED Medications  albuterol (PROVENTIL,VENTOLIN) solution continuous neb (has no administration in time range)  albuterol (PROVENTIL) (2.5 MG/3ML) 0.083% nebulizer solution 5 mg (5 mg Nebulization Given 02/25/18 0248)  methylPREDNISolone sodium succinate (SOLU-MEDROL) 125 mg/2 mL injection 125 mg (125 mg Intravenous Given 02/25/18 0407)  magnesium sulfate IVPB 2 g 50 mL (2 g Intravenous New Bag/Given 02/25/18 0407)  loratadine (CLARITIN) tablet 10 mg (10 mg Oral Given 02/25/18 0403)   MDM Reviewed: previous chart, nursing note and vitals Interpretation: labs and x-ray (negative ddimer normal creatinine.NACPD on cxr by  me) Total time providing critical care: 30-74 minutes (continuous nebs and magnesium). This excludes time spent performing separately reportable procedures and services. Consults: admitting MD  CRITICAL CARE Performed by: Carlisle Beers Total critical care time: 61 minutes Critical care time was exclusive of separately billable procedures and treating other patients. Critical care was necessary to treat or prevent imminent or life-threatening deterioration. Critical care was time spent personally by me on the following activities: development of treatment plan with patient and/or surrogate as well as nursing, discussions with consultants, evaluation of patient's response to treatment, examination of patient, obtaining history from patient or surrogate, ordering and performing treatments and interventions, ordering and review of laboratory studies, ordering and review of radiographic studies, pulse oximetry and re-evaluation of patient's condition.    Final Clinical Impressions(s) / ED Diagnoses  Admit for steroids and nebs due to continued wheezing and hypoxia      Raveen Wieseler, MD 02/25/18 7989

## 2018-02-25 NOTE — ED Triage Notes (Signed)
Pt stated "I've been having trouble breathing x 3 months.  I use ProAir @ home."  Wheezing with ausculation.

## 2018-02-25 NOTE — Progress Notes (Signed)
Patient preferring to hold off on ABG until she speaks with the MD. O2 sats on RA 94% at this time; no distress noted.

## 2018-02-26 DIAGNOSIS — R0602 Shortness of breath: Secondary | ICD-10-CM | POA: Diagnosis present

## 2018-02-26 DIAGNOSIS — J45901 Unspecified asthma with (acute) exacerbation: Secondary | ICD-10-CM | POA: Diagnosis not present

## 2018-02-26 DIAGNOSIS — J4531 Mild persistent asthma with (acute) exacerbation: Secondary | ICD-10-CM | POA: Diagnosis not present

## 2018-02-26 DIAGNOSIS — D72829 Elevated white blood cell count, unspecified: Secondary | ICD-10-CM

## 2018-02-26 DIAGNOSIS — R000 Tachycardia, unspecified: Secondary | ICD-10-CM | POA: Diagnosis not present

## 2018-02-26 LAB — COMPREHENSIVE METABOLIC PANEL
ALT: 82 U/L — ABNORMAL HIGH (ref 14–54)
AST: 36 U/L (ref 15–41)
Albumin: 4.4 g/dL (ref 3.5–5.0)
Alkaline Phosphatase: 85 U/L (ref 38–126)
Anion gap: 12 (ref 5–15)
BUN: 11 mg/dL (ref 6–20)
CO2: 19 mmol/L — ABNORMAL LOW (ref 22–32)
Calcium: 10.1 mg/dL (ref 8.9–10.3)
Chloride: 104 mmol/L (ref 101–111)
Creatinine, Ser: 0.8 mg/dL (ref 0.44–1.00)
GFR calc Af Amer: >60 mL/min (ref >60)
GFR calc non Af Amer: >60 mL/min (ref >60)
Glucose, Bld: 192 mg/dL — ABNORMAL HIGH (ref 65–99)
Potassium: 4.6 mmol/L (ref 3.5–5.1)
Sodium: 135 mmol/L (ref 135–145)
Total Bilirubin: 0.7 mg/dL (ref 0.3–1.2)
Total Protein: 8.5 g/dL — ABNORMAL HIGH (ref 6.5–8.1)

## 2018-02-26 LAB — CBC
HCT: 44.3 % (ref 36.0–46.0)
Hemoglobin: 15 g/dL (ref 12.0–15.0)
MCH: 30.9 pg (ref 26.0–34.0)
MCHC: 33.9 g/dL (ref 30.0–36.0)
MCV: 91.2 fL (ref 78.0–100.0)
Platelets: 296 10*3/uL (ref 150–400)
RBC: 4.86 MIL/uL (ref 3.87–5.11)
RDW: 13 % (ref 11.5–15.5)
WBC: 19.5 10*3/uL — ABNORMAL HIGH (ref 4.0–10.5)

## 2018-02-26 LAB — HIV ANTIBODY (ROUTINE TESTING W REFLEX): HIV Screen 4th Generation wRfx: NONREACTIVE

## 2018-02-26 MED ORDER — GUAIFENESIN ER 600 MG PO TB12: 600.0000 mg | ORAL_TABLET | Freq: Two times a day (BID) | ORAL | 0 refills | Status: DC

## 2018-02-26 MED ORDER — PREDNISONE 10 MG PO TABS: ORAL_TABLET | ORAL | 0 refills | Status: DC

## 2018-02-26 MED ORDER — LEVALBUTEROL HCL 0.63 MG/3ML IN NEBU: 0.6300 mg | INHALATION_SOLUTION | Freq: Four times a day (QID) | RESPIRATORY_TRACT | 0 refills | Status: DC | PRN

## 2018-02-26 NOTE — Discharge Summary (Signed)
Physician Discharge Summary  Melissa Moon XBD:[id number] DOB: Mar 09, 1986 DOA: 02/25/2018  PCP: Patient, No Pcp Per  Admit date: 02/25/2018 Discharge date: 02/26/2018  Time spent: 45 minutes  Recommendations for Outpatient Follow-up:  Patient will be discharged to home.  Patient will need to follow up with primary care provider within one week of discharge, repeat CBC.  Follow up with allergist and pulmonologist.  Follow-up with gynecologist.  Patient should continue medications as prescribed.  Patient should follow a regular diet.   Discharge Diagnoses:  Acute asthma exacerbation, moderate to severe Sinus tachycardia  Morbid obesity Falsely elevated hCG Leukocytosis  Discharge Condition: Stable  Diet recommendation: Regular  Filed Weights   02/25/18 1241  Weight: 93 kg (205 lb 0.4 oz)    History of present illness:  On 02/25/2018 by Dr. Reyne Dumas 32 year old female with a history of seasonal rhinitis,morbid obesity with a BMI of 35.9,asthma followed by Dr. Melvyn Novas, who presents to the ED today with gradual worsening  And difficulty breathing 3 months. She has experienced symptoms of wheezing, dyspnea on exertion, for the last 3-4 days, to the point that she could not catch a breath . She only uses albuterol and does not use a LABA ,or steroid inhaler . She is in process of finding a PCP. Marland KitchenShe has seen pulmonary but recently switched jobs and has not seen a provider recently . She was found to have  asthma exacerbation and was treated as following   Hospital Course:  Acute asthma exacerbation, moderate to severe -Initially presented with saturation of 89% on room air, patient declined an ABG on admission -D-dimer unremarkable, low concern for PE -Patient's oxygen saturation improving, only 95% on room air -CXR reviewed and unremarkable for infection -Unknown etiology, possibly due to allergies -was placed on nebs, steroids, and has improved -patient stated that she has been  steroids in the past, and once she completes them, she begins having symptoms again -she has an appointment with an allergist on 02/27/2018 -Patient has improved quicker than expected -will discharge patient with prolonged steroid taper and nebulizer -encouraged patient to follow up with allergist and pulmonologist   Sinus tachycardia  -resolved  -secondary to asthma exacerbation  Morbid obesity -Excess calories -follow up with PCP to discuss weight management  Falsely elevated hCG -Urine hCG positive however only 17.  Quantitative beta-hCG less than 1 -has Implanon -Advised patient to follow-up with her gynecologist  Leukocytosis -Likely reactive to steroids -repeat CBC in 1 week  Procedures: None  Consultations: None  Discharge Exam: Vitals:   02/26/18 0456 02/26/18 0939  BP: 140/85   Pulse: 83   Resp: (!) 22   Temp: 97.8 F (36.6 C)   SpO2: 91% 95%   Patient feeling better today, feels breathing has improved. No longer having wheezing and cough. Denies current chest pain, abdominal pain, N/V/D/C, dizziness, headache. Would like to go home.    General: Well developed, well nourished, NAD, appears stated age  HEENT: NCAT,  mucous membranes moist.  Neck: Supple  Cardiovascular: S1 S2 auscultated, no rubs, murmurs or gallops. Regular rate and rhythm.  Respiratory: End inspiratory wheeze, otherwise clear  Abdomen: Soft, obese, nontender, nondistended, + bowel sounds  Extremities: warm dry without cyanosis clubbing or edema  Neuro: AAOx3, nonfocal  Skin: Without rashes exudates or nodules  Psych: Normal affect and demeanor with intact judgement and insight  Discharge Instructions Discharge Instructions    DME Nebulizer machine   Complete by:  As directed    Patient  needs a nebulizer to treat with the following condition:  Asthma exacerbation   DME Nebulizer/meds   Complete by:  As directed    Patient needs a nebulizer to treat with the following  condition:  Asthma exacerbation   Discharge instructions   Complete by:  As directed    Patient will be discharged to home.  Patient will need to follow up with primary care provider within one week of discharge, repeat CBC.  Follow up with allergist and pulmonologist.  Follow-up with gynecologist.  Patient should continue medications as prescribed.  Patient should follow a regular diet.     Allergies as of 02/26/2018      Reactions   Cefaclor Other (See Comments)   Happened as a child    Tacrolimus Other (See Comments)   Happened as a child. unknown      Medication List    TAKE these medications   albuterol 108 (90 Base) MCG/ACT inhaler Commonly known as:  PROVENTIL HFA;VENTOLIN HFA Inhale 2 puffs into the lungs every 6 (six) hours as needed for wheezing or shortness of breath.   famotidine 20 MG tablet Commonly known as:  PEPCID One at bedtime   guaiFENesin 600 MG 12 hr tablet Commonly known as:  MUCINEX Take 1 tablet (600 mg total) by mouth 2 (two) times daily.   levalbuterol 0.63 MG/3ML nebulizer solution Commonly known as:  XOPENEX Take 3 mLs (0.63 mg total) by nebulization every 6 (six) hours as needed for up to 14 days for wheezing or shortness of breath.   mometasone-formoterol 100-5 MCG/ACT Aero Commonly known as:  DULERA Inhale 2 puffs into the lungs 2 (two) times daily.   predniSONE 10 MG tablet Commonly known as:  DELTASONE Take 6tabs x 3days, then take 5tabs x 3days, then take 4tabs x 3days, then take 3tabs x 3days, then take 2tabs x 3days, then take 1tabx3days   pseudoephedrine 120 MG 12 hr tablet Commonly known as:  SUDAFED Take 120 mg by mouth every 12 (twelve) hours as needed for congestion.            Durable Medical Equipment  (From admission, onward)        Start     Ordered   02/26/18 0000  DME Nebulizer machine    Question:  Patient needs a nebulizer to treat with the following condition  Answer:  Asthma exacerbation   02/26/18 1101    02/26/18 0000  DME Nebulizer/meds    Question:  Patient needs a nebulizer to treat with the following condition  Answer:  Asthma exacerbation   02/26/18 1101     Allergies  Allergen Reactions  . Cefaclor Other (See Comments)    Happened as a child   . Tacrolimus Other (See Comments)    Happened as a child. unknown   Follow-up Information    [employer]. Call.   Why:  Please call the number on the back of your insurance card to request a list of in-network doctors to set up as your primary doctor.           The results of significant diagnostics from this hospitalization (including imaging, microbiology, ancillary and laboratory) are listed below for reference.    Significant Diagnostic Studies: Dg Chest 2 View  Result Date: 02/25/2018 CLINICAL DATA:  32 y/o F; shortness of breath, wheezing, history of asthma. EXAM: CHEST - 2 VIEW COMPARISON:  11/12/2009 chest radiograph FINDINGS: Stable normal cardiac silhouette given projection and technique. Central bronchitic markings.  No focal consolidation. No pleural effusion or pneumothorax. Bones are unremarkable. IMPRESSION: Central bronchitic markings may represent bronchitis or reactive airways disease. No consolidation. Electronically Signed   By: Kristine Garbe M.D.   On: 02/25/2018 03:40    Microbiology: No results found for this or any previous visit (from the past 240 hour(s)).   Labs: Basic Metabolic Panel: Recent Labs  Lab 02/25/18 0359 02/25/18 1410 02/26/18 0640  NA 136  --  135  K 4.1  --  4.6  CL 107  --  104  CO2  --   --  19*  GLUCOSE 178*  --  192*  BUN 7  --  11  CREATININE 0.80 1.07* 0.80  CALCIUM  --   --  10.1  MG  --  2.3  --    Liver Function Tests: Recent Labs  Lab 02/26/18 0640  AST 36  ALT 82*  ALKPHOS 85  BILITOT 0.7  PROT 8.5*  ALBUMIN 4.4   No results for input(s): LIPASE, AMYLASE in the last 168 hours. No results for input(s): AMMONIA in the last 168  hours. CBC: Recent Labs  Lab 02/25/18 0320 02/25/18 0359 02/25/18 1410 02/26/18 0640  WBC 12.5*  --  12.6* 19.5*  NEUTROABS 6.6  --   --   --   HGB 15.4* 16.0* 15.4* 15.0  HCT 45.3 47.0* 46.0 44.3  MCV 89.2  --  89.7 91.2  PLT 305  --  308 296   Cardiac Enzymes: No results for input(s): CKTOTAL, CKMB, CKMBINDEX, TROPONINI in the last 168 hours. BNP: BNP (last 3 results) No results for input(s): BNP in the last 8760 hours.  ProBNP (last 3 results) No results for input(s): PROBNP in the last 8760 hours.  CBG: No results for input(s): GLUCAP in the last 168 hours.     Signed:  Cristal Ford  [facility] 02/26/2018, 11:02 AM

## 2018-02-26 NOTE — Care Management Note (Signed)
Case Management Note  Patient Details  Name: Aune Adami MRN: [id number] Date of Birth: [birth year]  Subjective/Objective:                  Discharge needs  Action/Plan: Advanced [facility] for nebulizer and meds  Expected Discharge Date:  02/26/18               Expected Discharge Plan:     In-House Referral:     Discharge planning Services     Post Acute Care Choice:    Choice offered to:     DME Arranged:    DME Agency:     HH Arranged:    [place] Agency:     Status of Service:     If discussed at [employer], dates discussed:    Additional Comments:  Leeroy Cha, RN 02/26/2018, 11:35 AM

## 2018-02-26 NOTE — Discharge Instructions (Signed)
Asthma, Adult Asthma is a recurring condition in which the airways tighten and narrow. Asthma can make it difficult to breathe. It can cause coughing, wheezing, and shortness of breath. Asthma episodes, also called asthma attacks, range from minor to life-threatening. Asthma cannot be cured, but medicines and lifestyle changes can help control it. What are the causes? Asthma is believed to be caused by inherited (genetic) and environmental factors, but its exact cause is unknown. Asthma may be triggered by allergens, lung infections, or irritants in the air. Asthma triggers are different for each person. Common triggers include:  Animal dander.  Dust mites.  Cockroaches.  Pollen from trees or grass.  Mold.  Smoke.  Air pollutants such as dust, household cleaners, hair sprays, aerosol sprays, paint fumes, strong chemicals, or strong odors.  Cold air, weather changes, and winds (which increase molds and pollens in the air).  Strong emotional expressions such as crying or laughing hard.  Stress.  Certain medicines (such as aspirin) or types of drugs (such as beta-blockers).  Sulfites in foods and drinks. Foods and drinks that may contain sulfites include dried fruit, potato chips, and sparkling grape juice.  Infections or inflammatory conditions such as the flu, a cold, or an inflammation of the nasal membranes (rhinitis).  Gastroesophageal reflux disease (GERD).  Exercise or strenuous activity.  What are the signs or symptoms? Symptoms may occur immediately after asthma is triggered or many hours later. Symptoms include:  Wheezing.  Excessive nighttime or early morning coughing.  Frequent or severe coughing with a common cold.  Chest tightness.  Shortness of breath.  How is this diagnosed? The diagnosis of asthma is made by a review of your medical history and a physical exam. Tests may also be performed. These may include:  Lung function studies. These tests show how  much air you breathe in and out.  Allergy tests.  Imaging tests such as X-rays.  How is this treated? Asthma cannot be cured, but it can usually be controlled. Treatment involves identifying and avoiding your asthma triggers. It also involves medicines. There are 2 classes of medicine used for asthma treatment:  Controller medicines. These prevent asthma symptoms from occurring. They are usually taken every day.  Reliever or rescue medicines. These quickly relieve asthma symptoms. They are used as needed and provide short-term relief.  Your health care provider will help you create an asthma action plan. An asthma action plan is a written plan for managing and treating your asthma attacks. It includes a list of your asthma triggers and how they may be avoided. It also includes information on when medicines should be taken and when their dosage should be changed. An action plan may also involve the use of a device called a peak flow meter. A peak flow meter measures how well the lungs are working. It helps you monitor your condition. Follow these instructions at home:  Take medicines only as directed by your health care provider. Speak with your health care provider if you have questions about how or when to take the medicines.  Use a peak flow meter as directed by your health care provider. Record and keep track of readings.  Understand and use the action plan to help minimize or stop an asthma attack without needing to seek medical care.  Control your home environment in the following ways to help prevent asthma attacks: ? Do not smoke. Avoid being exposed to secondhand smoke. ? Change your heating and air conditioning filter regularly. ? Limit  your use of fireplaces and wood stoves. ? Get rid of pests (such as roaches and mice) and their droppings. ? Throw away plants if you see mold on them. ? Clean your floors and dust regularly. Use unscented cleaning products. ? Try to have someone  else vacuum for you regularly. Stay out of rooms while they are being vacuumed and for a short while afterward. If you vacuum, use a dust mask from a hardware store, a double-layered or microfilter vacuum cleaner bag, or a vacuum cleaner with a HEPA filter. ? Replace carpet with wood, tile, or vinyl flooring. Carpet can trap dander and dust. ? Use allergy-proof pillows, mattress covers, and box spring covers. ? Wash bed sheets and blankets every week in hot water and dry them in a dryer. ? Use blankets that are made of polyester or cotton. ? Clean bathrooms and kitchens with bleach. If possible, have someone repaint the walls in these rooms with mold-resistant paint. Keep out of the rooms that are being cleaned and painted. ? Wash hands frequently. Contact a health care provider if:  You have wheezing, shortness of breath, or a cough even if taking medicine to prevent attacks.  The colored mucus you cough up (sputum) is thicker than usual.  Your sputum changes from clear or white to yellow, green, gray, or bloody.  You have any problems that may be related to the medicines you are taking (such as a rash, itching, swelling, or trouble breathing).  You are using a reliever medicine more than 2-3 times per week.  Your peak flow is still at 50-79% of your personal best after following your action plan for 1 hour.  You have a fever. Get help right away if:  You seem to be getting worse and are unresponsive to treatment during an asthma attack.  You are short of breath even at rest.  You get short of breath when doing very little physical activity.  You have difficulty eating, drinking, or talking due to asthma symptoms.  You develop chest pain.  You develop a fast heartbeat.  You have a bluish color to your lips or fingernails.  You are light-headed, dizzy, or faint.  Your peak flow is less than 50% of your personal best. This information is not intended to replace advice given to  you by your health care provider. Make sure you discuss any questions you have with your health care provider. Document Released: 10/09/2005 Document Revised: 03/22/2016 Document Reviewed: 05/08/2013 Elsevier Interactive Patient Education  2017 [place] Prevention, Adult Although you may not be able to control the fact that you have asthma, you can take actions to prevent episodes of asthma (asthma attacks). These actions include:  Creating a written plan for managing and treating your asthma attacks (asthma action plan).  Monitoring your asthma.  Avoiding things that can irritate your airways or make your asthma symptoms worse (asthma triggers).  Taking your medicines as directed.  Acting quickly if you have signs or symptoms of an asthma attack.  What are some ways to prevent an asthma attack? Create a plan Work with your health care provider to create an asthma action plan. This plan should include:  A list of your asthma triggers and how to avoid them.  A list of symptoms that you experience during an asthma attack.  Information about when to take medicine and how much medicine to take.  Information to help you understand your peak flow measurements.  Contact information  for your health care providers.  Daily actions that you can take to control asthma.  Monitor your asthma  To monitor your asthma:  Use your peak flow meter every morning and every evening for 2-3 weeks. Record the results in a journal. A drop in your peak flow numbers on one or more days may mean that you are starting to have an asthma attack, even if you are not having symptoms.  When you have asthma symptoms, write them down in a journal.  Avoid asthma triggers  Work with your health care provider to find out what your asthma triggers are. This can be done by:  Being tested for allergies.  Keeping a journal that notes when asthma attacks occur and what may have contributed  to them.  Asking your health care provider whether other medical conditions make your asthma worse.  Common asthma triggers include:  Dust.  Smoke. This includes campfire smoke and secondhand smoke from tobacco products.  Pet dander.  Trees, grasses or pollens.  Very cold, dry, or humid air.  Mold.  Foods that contain high amounts of sulfites.  Strong smells.  Engine exhaust and air pollution.  Aerosol sprays and fumes from household cleaners.  Household pests and their droppings, including dust mites and cockroaches.  Certain medicines, including NSAIDs.  Once you have determined your asthma triggers, take steps to avoid them. Depending on your triggers, you may be able to reduce the chance of an asthma attack by:  Keeping your home clean. Have someone dust and vacuum your home for you 1 or 2 times a week. If possible, have them use a high-efficiency particulate arrestance (HEPA) vacuum.  Washing your sheets weekly in hot water.  Using allergy-proof mattress covers and casings on your bed.  Keeping pets out of your home.  Taking care of mold and water problems in your home.  Avoiding areas where people smoke.  Avoiding using strong perfumes or odor sprays.  Avoid spending a lot of time outdoors when pollen counts are high and on very windy days.  Talking with your health care provider before stopping or starting any new medicines.  Medicines Take over-the-counter and prescription medicines only as told by your health care provider. Many asthma attacks can be prevented by carefully following your medicine schedule. Taking your medicines correctly is especially important when you cannot avoid certain asthma triggers. Even if you are doing well, do not stop taking your medicine and do not take less medicine. Act quickly If an asthma attack happens, acting quickly can decrease how severe it is and how long it lasts. Take these actions:  Pay attention to your  symptoms. If you are coughing, wheezing, or having difficulty breathing, do not wait to see if your symptoms go away on their own. Follow your asthma action plan.  If you have followed your asthma action plan and your symptoms are not improving, call your health care provider or seek immediate medical care at the nearest hospital.  It is important to write down how often you need to use your fast-acting rescue inhaler. You can track how often you use an inhaler in your journal. If you are using your rescue inhaler more often, it may mean that your asthma is not under control. Adjusting your asthma treatment plan may help you to prevent future asthma attacks and help you to gain better control of your condition. How can I prevent an asthma attack when I exercise?  Exercise is a common asthma trigger.  To prevent asthma attacks during exercise:  Follow advice from your health care provider about whether you should use your fast-acting inhaler before exercising. Many people with asthma experience exercise-induced bronchoconstriction (EIB). This condition often worsens during vigorous exercise in cold, humid, or dry environments. Usually, people with EIB can stay very active by using a fast-acting inhaler before exercising.  Avoid exercising outdoors in very cold or humid weather.  Avoid exercising outdoors when pollen counts are high.  Warm up and cool down when exercising.  Stop exercising right away if asthma symptoms start.  Consider taking part in exercises that are less likely to cause asthma symptoms such as:  Indoor swimming.  Biking.  Walking.  Hiking.  Playing football.  This information is not intended to replace advice given to you by your health care provider. Make sure you discuss any questions you have with your health care provider. Document Released: 09/27/2009 Document Revised: 06/09/2016 Document Reviewed: 03/25/2016 Elsevier Interactive Patient Education  [employer].

## 2018-02-27 DIAGNOSIS — Z91018 Allergy to other foods: Secondary | ICD-10-CM | POA: Diagnosis not present

## 2018-02-27 DIAGNOSIS — J301 Allergic rhinitis due to pollen: Secondary | ICD-10-CM | POA: Diagnosis not present

## 2018-02-27 DIAGNOSIS — J3089 Other allergic rhinitis: Secondary | ICD-10-CM | POA: Diagnosis not present

## 2018-02-27 DIAGNOSIS — J302 Other seasonal allergic rhinitis: Secondary | ICD-10-CM | POA: Diagnosis not present

## 2018-02-27 DIAGNOSIS — J4551 Severe persistent asthma with (acute) exacerbation: Secondary | ICD-10-CM | POA: Diagnosis not present

## 2018-03-04 DIAGNOSIS — J3081 Allergic rhinitis due to animal (cat) (dog) hair and dander: Secondary | ICD-10-CM | POA: Diagnosis not present

## 2018-03-04 DIAGNOSIS — J301 Allergic rhinitis due to pollen: Secondary | ICD-10-CM | POA: Diagnosis not present

## 2018-03-04 DIAGNOSIS — J3089 Other allergic rhinitis: Secondary | ICD-10-CM | POA: Diagnosis not present

## 2018-03-04 DIAGNOSIS — J302 Other seasonal allergic rhinitis: Secondary | ICD-10-CM | POA: Diagnosis not present

## 2018-03-05 DIAGNOSIS — F419 Anxiety disorder, unspecified: Secondary | ICD-10-CM | POA: Diagnosis not present

## 2018-03-05 DIAGNOSIS — E669 Obesity, unspecified: Secondary | ICD-10-CM | POA: Diagnosis not present

## 2018-03-19 DIAGNOSIS — Z6836 Body mass index (BMI) 36.0-36.9, adult: Secondary | ICD-10-CM | POA: Diagnosis not present

## 2018-03-19 DIAGNOSIS — L245 Irritant contact dermatitis due to other chemical products: Secondary | ICD-10-CM | POA: Diagnosis not present

## 2018-04-05 DIAGNOSIS — F419 Anxiety disorder, unspecified: Secondary | ICD-10-CM | POA: Diagnosis not present

## 2018-04-17 DIAGNOSIS — J4551 Severe persistent asthma with (acute) exacerbation: Secondary | ICD-10-CM | POA: Diagnosis not present

## 2018-04-17 DIAGNOSIS — J3081 Allergic rhinitis due to animal (cat) (dog) hair and dander: Secondary | ICD-10-CM | POA: Diagnosis not present

## 2018-04-17 DIAGNOSIS — J3089 Other allergic rhinitis: Secondary | ICD-10-CM | POA: Diagnosis not present

## 2018-04-17 DIAGNOSIS — J302 Other seasonal allergic rhinitis: Secondary | ICD-10-CM | POA: Diagnosis not present

## 2018-04-17 DIAGNOSIS — Z91018 Allergy to other foods: Secondary | ICD-10-CM | POA: Diagnosis not present

## 2018-04-17 DIAGNOSIS — J301 Allergic rhinitis due to pollen: Secondary | ICD-10-CM | POA: Diagnosis not present

## 2018-06-27 DIAGNOSIS — J45901 Unspecified asthma with (acute) exacerbation: Secondary | ICD-10-CM | POA: Diagnosis not present

## 2018-06-27 DIAGNOSIS — R062 Wheezing: Secondary | ICD-10-CM | POA: Diagnosis not present

## 2018-07-02 DIAGNOSIS — Z91018 Allergy to other foods: Secondary | ICD-10-CM | POA: Diagnosis not present

## 2018-07-02 DIAGNOSIS — J329 Chronic sinusitis, unspecified: Secondary | ICD-10-CM | POA: Diagnosis not present

## 2018-07-02 DIAGNOSIS — J302 Other seasonal allergic rhinitis: Secondary | ICD-10-CM | POA: Diagnosis not present

## 2018-07-02 DIAGNOSIS — J019 Acute sinusitis, unspecified: Secondary | ICD-10-CM | POA: Diagnosis not present

## 2018-07-09 DIAGNOSIS — F419 Anxiety disorder, unspecified: Secondary | ICD-10-CM | POA: Diagnosis not present

## 2018-07-15 ENCOUNTER — Encounter: Payer: Self-pay | Admitting: Gastroenterology

## 2018-07-25 DIAGNOSIS — J4551 Severe persistent asthma with (acute) exacerbation: Secondary | ICD-10-CM | POA: Diagnosis not present

## 2018-08-05 DIAGNOSIS — Z3049 Encounter for surveillance of other contraceptives: Secondary | ICD-10-CM | POA: Diagnosis not present

## 2018-08-15 DIAGNOSIS — Z01419 Encounter for gynecological examination (general) (routine) without abnormal findings: Secondary | ICD-10-CM | POA: Diagnosis not present

## 2018-08-15 DIAGNOSIS — I10 Essential (primary) hypertension: Secondary | ICD-10-CM | POA: Diagnosis not present

## 2018-08-15 DIAGNOSIS — N823 Fistula of vagina to large intestine: Secondary | ICD-10-CM | POA: Diagnosis not present

## 2018-08-15 DIAGNOSIS — Z3046 Encounter for surveillance of implantable subdermal contraceptive: Secondary | ICD-10-CM | POA: Diagnosis not present

## 2018-08-15 DIAGNOSIS — Z1151 Encounter for screening for human papillomavirus (HPV): Secondary | ICD-10-CM | POA: Diagnosis not present

## 2018-08-26 ENCOUNTER — Encounter: Payer: Self-pay | Admitting: Gastroenterology

## 2018-08-26 ENCOUNTER — Ambulatory Visit: Payer: BLUE CROSS/BLUE SHIELD | Admitting: Gastroenterology

## 2018-08-26 ENCOUNTER — Other Ambulatory Visit (INDEPENDENT_AMBULATORY_CARE_PROVIDER_SITE_OTHER): Payer: BLUE CROSS/BLUE SHIELD

## 2018-08-26 VITALS — BP 138/94 | HR 86 | Ht 63.0 in | Wt 195.0 lb

## 2018-08-26 DIAGNOSIS — K604 Rectal fistula: Secondary | ICD-10-CM | POA: Diagnosis not present

## 2018-08-26 DIAGNOSIS — K219 Gastro-esophageal reflux disease without esophagitis: Secondary | ICD-10-CM

## 2018-08-26 DIAGNOSIS — N828 Other female genital tract fistulae: Secondary | ICD-10-CM

## 2018-08-26 DIAGNOSIS — K602 Anal fissure, unspecified: Secondary | ICD-10-CM

## 2018-08-26 DIAGNOSIS — R1310 Dysphagia, unspecified: Secondary | ICD-10-CM

## 2018-08-26 DIAGNOSIS — K529 Noninfective gastroenteritis and colitis, unspecified: Secondary | ICD-10-CM

## 2018-08-26 LAB — CBC WITH DIFFERENTIAL/PLATELET
Basophils Absolute: 0 10*3/uL (ref 0.0–0.1)
Basophils Relative: 0.1 % (ref 0.0–3.0)
Eosinophils Absolute: 0 10*3/uL (ref 0.0–0.7)
Eosinophils Relative: 0 % (ref 0.0–5.0)
HCT: 44.6 % (ref 36.0–46.0)
Hemoglobin: 15.2 g/dL — ABNORMAL HIGH (ref 12.0–15.0)
Lymphocytes Relative: 36 % (ref 12.0–46.0)
Lymphs Abs: 2.5 10*3/uL (ref 0.7–4.0)
MCHC: 34 g/dL (ref 30.0–36.0)
MCV: 88.2 fl (ref 78.0–100.0)
Monocytes Absolute: 0.3 10*3/uL (ref 0.1–1.0)
Monocytes Relative: 5 % (ref 3.0–12.0)
Neutro Abs: 4.1 10*3/uL (ref 1.4–7.7)
Neutrophils Relative %: 58.9 % (ref 43.0–77.0)
Platelets: 225 10*3/uL (ref 150.0–400.0)
RBC: 5.06 Mil/uL (ref 3.87–5.11)
RDW: 12.4 % (ref 11.5–15.5)
WBC: 7 10*3/uL (ref 4.0–10.5)

## 2018-08-26 LAB — COMPREHENSIVE METABOLIC PANEL
ALT: 67 U/L — ABNORMAL HIGH (ref 0–35)
AST: 38 U/L — ABNORMAL HIGH (ref 0–37)
Albumin: 4.7 g/dL (ref 3.5–5.2)
Alkaline Phosphatase: 101 U/L (ref 39–117)
BUN: 6 mg/dL (ref 6–23)
CO2: 25 mEq/L (ref 19–32)
Calcium: 10.3 mg/dL (ref 8.4–10.5)
Chloride: 99 mEq/L (ref 96–112)
Creatinine, Ser: 0.87 mg/dL (ref 0.40–1.20)
GFR: 80.93 mL/min (ref >60.00)
Glucose, Bld: 323 mg/dL — ABNORMAL HIGH (ref 70–99)
Potassium: 3.9 mEq/L (ref 3.5–5.1)
Sodium: 135 mEq/L (ref 135–145)
Total Bilirubin: 0.8 mg/dL (ref 0.2–1.2)
Total Protein: 8.1 g/dL (ref 6.0–8.3)

## 2018-08-26 LAB — VITAMIN B12: Vitamin B-12: 239 pg/mL (ref 211–911)

## 2018-08-26 LAB — FOLATE: Folate: 12.6 ng/mL (ref >5.9)

## 2018-08-26 LAB — FERRITIN: Ferritin: 69.8 ng/mL (ref 10.0–291.0)

## 2018-08-26 LAB — SEDIMENTATION RATE: Sed Rate: 42 mm/hr — ABNORMAL HIGH (ref 0–20)

## 2018-08-26 LAB — HIGH SENSITIVITY CRP: CRP, High Sensitivity: 4.03 mg/L (ref 0.000–5.000)

## 2018-08-26 MED ORDER — NA SULFATE-K SULFATE-MG SULF 17.5-3.13-1.6 GM/177ML PO SOLN: ORAL | 0 refills | Status: DC

## 2018-08-26 MED ORDER — COLESTIPOL HCL 1 G PO TABS: 2.0000 g | ORAL_TABLET | Freq: Two times a day (BID) | ORAL | 3 refills | Status: DC

## 2018-08-26 NOTE — Patient Instructions (Addendum)
You have been scheduled for a CT/ENTEROGRAPHY  scan of the abdomen and pelvis at [facility] ([address]---this is in the same building as Press [facility]).   You are scheduled on 09/02/2018 at [facility] 1:45PM HOUR PRIOR TO YOUR EXAM TO DRINK CONTRAST   WARNING: IF YOU ARE ALLERGIC TO IODINE/X-RAY DYE, PLEASE NOTIFY RADIOLOGY IMMEDIATELY AT [id number]! YOU WILL BE GIVEN A 13 HOUR PREMEDICATION PREP.  NOTHING TO EAT OR DRINK FOUR HOURS PRIOR TO TEST    You may take any medications as prescribed with a small amount of water, if necessary. If you take any of the following medications: METFORMIN, GLUCOPHAGE, GLUCOVANCE, AVANDAMET, RIOMET, FORTAMET, Clifford MET, JANUMET, GLUMETZA or METAGLIP, you MAY be asked to HOLD this medication 48 hours AFTER the exam.  The purpose of you drinking the oral contrast is to aid in the visualization of your intestinal tract. The contrast solution may cause some diarrhea. Depending on your individual set of symptoms, you may also receive an intravenous injection of x-ray contrast/dye. Plan on being at [facility] for 30 minutes or longer, depending on the type of exam you are having performed.  This test typically takes 30-45 minutes to complete.  If you have any questions regarding your exam or if you need to reschedule, you may call the CT department at [phone] between the hours of 8:00 am and 5:00 pm, Monday-Friday.  You have been scheduled for a colonoscopy. Please follow written instructions given to you at your visit today.  Please pick up your prep supplies at the pharmacy within the next 1-3 days. If you use inhalers (even only as needed), please bring them with you on the day of your procedure. Your physician has requested that you go to www.startemmi.com and enter the access code given to you at your visit today. This web site gives a general overview about your procedure. However, you should still follow specific  instructions given to you by our office regarding your preparation for the procedure.  Go to the basement for labs today   You have been scheduled for a colonoscopy. Please follow written instructions given to you at your visit today.  Please pick up your prep supplies at the pharmacy within the next 1-3 days. If you use inhalers (even only as needed), please bring them with you on the day of your procedure. Your physician has requested that you go to www.startemmi.com and enter the access code given to you at your visit today. This web site gives a general overview about your procedure. However, you should still follow specific instructions given to you by our office regarding your preparation for the procedure.  Thank you for choosing [facility]  Kavitha Nandigam,MD   ________________________________________________________________________

## 2018-08-26 NOTE — Progress Notes (Signed)
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  She started noticing gas with odour and also stool coming through her vagina in the past 4 to 5 months.  She was diagnosed with ulcerative colitis at around age 41 had surgical resection and colostomy which was taken down few months later and reconnected.  She has not followed with GI since then as she was relatively asymptomatic other than  diarrhea.  She has 6-7 formed to semi-formed bowel movements daily with occasional liquid stool.  She saw GYN for vaginal discharge and was noted to have feculent discharge.  Has history of asthma and has been vomiting off prednisone course 4-5 times in the past year.  She does not recall any improvement of diarrhea or change in symptoms when she goes on prednisone She has almost daily heartburn and intermittent dysphagia worse with solid food and sometimes also with liquids.  She has generalized abdominal discomfort with decreased appetite and intermittent nausea.  Denies any vomiting.  No recent weight loss but has had significant weight fluctuation in the past year especially when she has been on and off prednisone.  No recent imaging or endoscopic evaluation.  Do not have any records of prior surgery or evaluation.  Her mother passed away and she did not follow-up as she was doing all right until this past year  Family history of pancreatic cancer  Outpatient Encounter Medications as of 08/26/2018  Medication Sig  . [DISCONTINUED] albuterol (PROVENTIL HFA;VENTOLIN HFA) 108 (90 Base) MCG/ACT inhaler Inhale 2 puffs into the lungs every 6 (six) hours as needed for wheezing or shortness of breath.   . [DISCONTINUED] famotidine (PEPCID) 20 MG tablet One at bedtime (Patient not taking: Reported on 02/25/2018)  . [DISCONTINUED] guaiFENesin (MUCINEX) 600 MG 12 hr tablet Take 1 tablet (600 mg total) by mouth 2 (two) times daily.  . [DISCONTINUED] levalbuterol (XOPENEX) 0.63 MG/3ML nebulizer solution Take 3 mLs (0.63 mg total) by nebulization every 6 (six) hours as needed for up to 14 days for wheezing or shortness of breath.  . [DISCONTINUED] mometasone-formoterol (DULERA) 100-5 MCG/ACT AERO Inhale 2 puffs into the lungs 2 (two) times daily. (Patient not taking: Reported on 02/25/2018)  . [DISCONTINUED] predniSONE (DELTASONE) 10 MG tablet Take 6tabs x 3days, then take 5tabs x 3days, then take 4tabs x 3days, then take 3tabs x 3days, then take 2tabs x 3days, then take 1tabx3days  . [DISCONTINUED] pseudoephedrine (SUDAFED) 120 MG 12 hr tablet Take 120 mg by mouth every 12 (twelve) hours as needed for congestion.   No facility-administered encounter medications on file as of 08/26/2018.     Allergies as of 08/26/2018 - Review Complete 08/26/2018  Allergen Reaction Noted  . Cefaclor Other (See Comments) 02/18/2017  . Tacrolimus Other (See Comments) 02/18/2017    Past Medical History:  Diagnosis Date  . Anxiety and depression   . Asthma   . GERD (gastroesophageal reflux disease)   . Migraine   . UC (ulcerative colitis) [facility])     Past Surgical History:  Procedure Laterality Date  . CESAREAN SECTION  2013  . PARTIAL COLECTOMY  2000/01    Family History  Problem Relation Age of Onset  .  Pancreatic cancer Mother        Deceased at age 59  . Diabetes Mother   . Kidney disease Mother   . Liver disease Father   . COPD Father   . Heart attack Father        deceased at age 29  . Breast cancer Sister     Social History   Socioeconomic History  . Marital status: Married    Spouse name: Not on file  . Number of children: 1  . Years of education: Not on file  . Highest education level:  Not on file  Occupational History  . Occupation: Actor  Social Needs  . Financial resource strain: Not on file  . Food insecurity:    Worry: Not on file    Inability: Not on file  . Transportation needs:    Medical: Not on file    Non-medical: Not on file  Tobacco Use  . Smoking status: Never Smoker  . Smokeless tobacco: Never Used  Substance and Sexual Activity  . Alcohol use: No  . Drug use: No  . Sexual activity: Yes    Partners: Male    Birth control/protection: Implant  Lifestyle  . Physical activity:    Days per week: Not on file    Minutes per session: Not on file  . Stress: Not on file  Relationships  . Social connections:    Talks on phone: Not on file    Gets together: Not on file    Attends religious service: Not on file    Active member of club or organization: Not on file    Attends meetings of clubs or organizations: Not on file    Relationship status: Not on file  . Intimate partner violence:    Fear of current or ex partner: Not on file    Emotionally abused: Not on file    Physically abused: Not on file    Forced sexual activity: Not on file  Other Topics Concern  . Not on file  Social History Narrative   Never married. 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       Review of systems: Review of Systems  Constitutional: Negative for fever and chills. Positive for fatigue HENT: Positive for sinus trouble Eyes: Negative for blurred vision.  Respiratory: Negative for shortness of breath and wheezing.  Positive for cough Cardiovascular: Negative for chest pain and palpitations.  Gastrointestinal: as per HPI Genitourinary: Negative for dysuria, urgency, frequency and hematuria.  Musculoskeletal: Positive for myalgias, back pain and joint pain.  Skin: Negative for itching and rash.  Neurological: Negative for dizziness, tremors, focal weakness, seizures and loss of consciousness. Positive for headaches Endo/Heme/Allergies:  Positive for seasonal allergies.  Psychiatric/Behavioral: Negative for depression, suicidal ideas and hallucinations.  All other systems reviewed and are negative.   Physical Exam: Vitals:   08/26/18 0952  BP: (!) 138/94  Pulse: 86   Body mass index is 34.54 kg/m. Gen:      No acute distress HEENT:  EOMI, sclera anicteric Neck:     No masses; no thyromegaly Lungs:    Clear to auscultation bilaterally; normal respiratory effort CV:         Regular rate and rhythm; no murmurs Abd:      + bowel sounds; soft, non-tender; no palpable masses, no distension Ext:    No edema; adequate peripheral perfusion Skin:      Warm and dry; no rash Neuro: alert and oriented x  3 Psych: normal mood and affect Rectal exam: Increased anal sphincter tone with tenderness, + anal fissure and ?  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  Fissure versus anal fistula Will schedule for CT enterography to evaluate postsurgical anatomy and exclude involvement of small bowel in addition to colon as her presentation is concerning for possible Crohn's disease Scheduled for EGD and colonoscopy following CT enterography for evaluation of dysphagia and also to evaluate colonic mucosa to determine extent and degree of inflammation.  Antireflux measures. Occasional biopsies and possible dilation for dysphagia Diarrhea could be bile salt induced given she had segmental colon resection.  We will do a trial of Colestid 1 g twice daily. Check CBC, CMP, ferritin, iron panel, folate, B12, ESR and CRP The risks and benefits as well as alternatives of endoscopic procedure(s) have been  discussed and reviewed. All questions answered. The patient agrees to proceed. Further recommendation based on findings of CT enterography, EGD and colonoscopy. Return in 2 to 4 weeks or sooner if needed  K. Denzil Magnuson , MD [phone]    CC: Marda Stalker, PA-C

## 2018-08-27 DIAGNOSIS — J4551 Severe persistent asthma with (acute) exacerbation: Secondary | ICD-10-CM | POA: Diagnosis not present

## 2018-09-02 ENCOUNTER — Ambulatory Visit (INDEPENDENT_AMBULATORY_CARE_PROVIDER_SITE_OTHER)
Admission: RE | Admit: 2018-09-02 | Discharge: 2018-09-02 | Disposition: A | Discharge status: Home / Self Care | Payer: BLUE CROSS/BLUE SHIELD | Source: Ambulatory Visit | Attending: Gastroenterology | Admitting: Gastroenterology

## 2018-09-02 DIAGNOSIS — N828 Other female genital tract fistulae: Secondary | ICD-10-CM

## 2018-09-02 DIAGNOSIS — K760 Fatty (change of) liver, not elsewhere classified: Secondary | ICD-10-CM | POA: Diagnosis not present

## 2018-09-02 DIAGNOSIS — K604 Rectal fistula: Secondary | ICD-10-CM

## 2018-09-02 DIAGNOSIS — K602 Anal fissure, unspecified: Secondary | ICD-10-CM

## 2018-09-02 DIAGNOSIS — R197 Diarrhea, unspecified: Secondary | ICD-10-CM | POA: Diagnosis not present

## 2018-09-02 MED ORDER — IOPAMIDOL (ISOVUE-300) INJECTION 61%
100.0000 mL | Freq: Once | INTRAVENOUS | Status: AC | PRN
  Administered 2018-09-02: 100 mL via INTRAVENOUS

## 2018-09-09 ENCOUNTER — Encounter: Payer: Self-pay | Admitting: Gastroenterology

## 2018-09-09 ENCOUNTER — Ambulatory Visit (AMBULATORY_SURGERY_CENTER): Payer: BLUE CROSS/BLUE SHIELD | Admitting: Gastroenterology

## 2018-09-09 VITALS — BP 98/54 | HR 77 | Temp 99.1°F | Resp 14 | Ht 63.0 in | Wt 195.0 lb

## 2018-09-09 DIAGNOSIS — K624 Stenosis of anus and rectum: Secondary | ICD-10-CM

## 2018-09-09 DIAGNOSIS — K5190 Ulcerative colitis, unspecified, without complications: Secondary | ICD-10-CM | POA: Diagnosis not present

## 2018-09-09 DIAGNOSIS — K51819 Other ulcerative colitis with unspecified complications: Secondary | ICD-10-CM

## 2018-09-09 DIAGNOSIS — K529 Noninfective gastroenteritis and colitis, unspecified: Secondary | ICD-10-CM | POA: Diagnosis not present

## 2018-09-09 MED ORDER — CHOLESTYRAMINE LIGHT 4 G PO PACK: 4.0000 g | PACK | Freq: Two times a day (BID) | ORAL | 3 refills | Status: DC

## 2018-09-09 MED ORDER — SODIUM CHLORIDE 0.9 % IV SOLN: 500.0000 mL | Freq: Once | INTRAVENOUS | Status: DC

## 2018-09-09 MED ORDER — MESALAMINE 1000 MG RE SUPP: 1000.0000 mg | Freq: Every day | RECTAL | 0 refills | Status: DC

## 2018-09-09 NOTE — Op Note (Signed)
[facility] Patient Name: Melissa Moon Procedure Date: 09/09/2018 1:58 PM MRN: [id number] Endoscopist: Mauri Pole , MD Age: 32 Referring MD:  Date of Birth: 07-17-1986 Gender: Female Account #: [id number] Procedure:                Colonoscopy Indications:              Evaluation of unexplained GI bleeding, ?colovaginal                            fistula, Obtain more precise diagnosis of                            inflammatory bowel disease, Clinically significant                            diarrhea of unexplained origin. History of                            ulcerative colitis s/p total colectomy at age 32 Medicines:                Monitored Anesthesia Care Procedure:                Pre-Anesthesia Assessment:                           - Prior to the procedure, a History and Physical                            was performed, and patient medications and                            allergies were reviewed. The patient's tolerance of                            previous anesthesia was also reviewed. The risks                            and benefits of the procedure and the sedation                            options and risks were discussed with the patient.                            All questions were answered, and informed consent                            was obtained. Prior Anticoagulants: The patient has                            taken no previous anticoagulant or antiplatelet                            agents. ASA Grade Assessment: III - A patient with  severe systemic disease. After reviewing the risks                            and benefits, the patient was deemed in                            satisfactory condition to undergo the procedure.                           After obtaining informed consent, the colonoscope                            was passed under direct vision. Throughout the                            procedure, the  patient's blood pressure, pulse, and                            oxygen saturations were monitored continuously. The                            Colonoscope was introduced through the anus and                            advanced to the the anus to examine a stenosis.                            This was the intended extent. The colonoscopy was                            technically difficult and complex due to                            post-surgical anatomy. The patient tolerated the                            procedure well. The quality of the bowel                            preparation was good. The rectum was photographed. Scope In: 2:16:16 PM Scope Out: 2:21:29 PM Total Procedure Duration: 0 hours 5 minutes 13 seconds  Findings:                 The digital rectal exam findings include rectal                            tenderness and anal stricture.                           A benign-appearing, intrinsic severe stenosis                            measuring 1-2 cm (in length) x 8 mm (inner  diameter) was found at the anus and was                            non-traversed. Biopsies were taken with a cold                            forceps for histology form rectum and stricture.                            Limited visualization of mucosa in J pouch with no                            visible ulcers or inflammatory changes. Complications:            No immediate complications. Estimated Blood Loss:     Estimated blood loss was minimal. Impression:               - Rectal tenderness and anal stricture found on                            digital rectal exam.                           - Stricture at the anus. Biopsied. Differential                            includes surgical anastomotic stricture vs Crohn's                            disease Recommendation:           - Low residue diet.                           - Continue present medications.                            - Use Benefiber one teaspoon PO TID.                           - Cholestyramine 1 packet daily                           - Use Canasa 1000 mg suppository 1 per rectum QHS                            for 2 weeks.                           - Refer to a colo-rectal surgeon at appointment to                            be scheduled for management of severe anal stricture                           - No aspirin, ibuprofen, naproxen, or other  non-steroidal anti-inflammatory drugs.                           - Await pathology results.                           - Repeat colonoscopy date to be determined after                            pending pathology results are reviewed for                            surveillance based on pathology results.                           - Return to GI clinic at the next available                            appointment in 2-4 weeks. Mauri Pole, MD 09/09/2018 2:30:07 PM This report has been signed electronically.

## 2018-09-09 NOTE — Progress Notes (Signed)
Called to room to assist during endoscopic procedure.  Patient ID and intended procedure confirmed with present staff. Received instructions for my participation in the procedure from the performing physician.  

## 2018-09-09 NOTE — Progress Notes (Signed)
PT taken to PACU. Monitors in place. VSS. Report given to RN. 

## 2018-09-09 NOTE — Progress Notes (Signed)
I have reviewed the patient's medical history in detail and updated the computerized patient record.

## 2018-09-09 NOTE — Patient Instructions (Signed)
Discharge instructions given. No ibuprofen, naproxen, or other NSAIDs for two weeks. Resume previous medications. Office will call and schedule appointment. Repeat colonoscopy after results of pathology. YOU HAD AN ENDOSCOPIC PROCEDURE TODAY AT [facility]:   Refer to the procedure report that was given to you for any specific questions about what was found during the examination.  If the procedure report does not answer your questions, please call your gastroenterologist to clarify.  If you requested that your care partner not be given the details of your procedure findings, then the procedure report has been included in a sealed envelope for you to review at your convenience later.  YOU SHOULD EXPECT: Some feelings of bloating in the abdomen. Passage of more gas than usual.  Walking can help get rid of the air that was put into your GI tract during the procedure and reduce the bloating. If you had a lower endoscopy (such as a colonoscopy or flexible sigmoidoscopy) you may notice spotting of blood in your stool or on the toilet paper. If you underwent a bowel prep for your procedure, you may not have a normal bowel movement for a few days.  Please Note:  You might notice some irritation and congestion in your nose or some drainage.  This is from the oxygen used during your procedure.  There is no need for concern and it should clear up in a day or so.  SYMPTOMS TO REPORT IMMEDIATELY:   Following lower endoscopy (colonoscopy or flexible sigmoidoscopy):  Excessive amounts of blood in the stool  Significant tenderness or worsening of abdominal pains  Swelling of the abdomen that is new, acute  Fever of 100F or higher   For urgent or emergent issues, a gastroenterologist can be reached at any hour by calling [phone].   DIET:  We do recommend a small meal at first, but then you may proceed to your regular diet.  Drink plenty of fluids but you should avoid alcoholic  beverages for 24 hours.  ACTIVITY:  You should plan to take it easy for the rest of today and you should NOT DRIVE or use heavy machinery until tomorrow (because of the sedation medicines used during the test).    FOLLOW UP: Our staff will call the number listed on your records the next business day following your procedure to check on you and address any questions or concerns that you may have regarding the information given to you following your procedure. If we do not reach you, we will leave a message.  However, if you are feeling well and you are not experiencing any problems, there is no need to return our call.  We will assume that you have returned to your regular daily activities without incident.  If any biopsies were taken you will be contacted by phone or by letter within the next 1-3 weeks.  Please call us at [phone] if you have not heard about the biopsies in 3 weeks.    SIGNATURES/CONFIDENTIALITY: You and/or your care partner have signed paperwork which will be entered into your electronic medical record.  These signatures attest to the fact that that the information above on your After Visit Summary has been reviewed and is understood.  Full responsibility of the confidentiality of this discharge information lies with you and/or your care-partner.

## 2018-09-10 ENCOUNTER — Telehealth: Payer: Self-pay | Admitting: *Deleted

## 2018-09-10 NOTE — Telephone Encounter (Signed)
  Follow up Call-  Call back number 09/09/2018  Post procedure Call Back phone  # [phone]  Permission to leave phone message Yes  Some recent data might be hidden     Patient questions:  Do you have a fever, pain , or abdominal swelling? No. Pain Score  0 *  Have you tolerated food without any problems? Yes.    Have you been able to return to your normal activities? Yes.    Do you have any questions about your discharge instructions: Diet   No. Medications  No. Follow up visit  No.  Do you have questions or concerns about your Care? No.  Actions: * If pain score is 4 or above: No action needed, pain <4.

## 2018-09-18 ENCOUNTER — Other Ambulatory Visit: Payer: Self-pay

## 2018-09-18 ENCOUNTER — Telehealth: Payer: Self-pay

## 2018-09-18 NOTE — Telephone Encounter (Signed)
Records, pathology report, procedure not and insurance information faxed to [facility] for management of a severe anal stricture.

## 2018-09-26 DIAGNOSIS — J4551 Severe persistent asthma with (acute) exacerbation: Secondary | ICD-10-CM | POA: Diagnosis not present

## 2018-10-08 DIAGNOSIS — R1900 Intra-abdominal and pelvic swelling, mass and lump, unspecified site: Secondary | ICD-10-CM | POA: Diagnosis not present

## 2018-10-18 DIAGNOSIS — F410 Panic disorder [episodic paroxysmal anxiety] without agoraphobia: Secondary | ICD-10-CM | POA: Diagnosis not present

## 2018-10-18 DIAGNOSIS — F331 Major depressive disorder, recurrent, moderate: Secondary | ICD-10-CM | POA: Diagnosis not present

## 2018-10-18 DIAGNOSIS — F411 Generalized anxiety disorder: Secondary | ICD-10-CM | POA: Diagnosis not present

## 2018-10-23 ENCOUNTER — Telehealth (HOSPITAL_COMMUNITY): Payer: Self-pay | Admitting: Family Medicine

## 2018-10-23 ENCOUNTER — Ambulatory Visit (HOSPITAL_COMMUNITY)
Admission: EM | Admit: 2018-10-23 | Discharge: 2018-10-23 | Disposition: A | Discharge status: Home / Self Care | Payer: BLUE CROSS/BLUE SHIELD | Attending: Family Medicine | Admitting: Family Medicine

## 2018-10-23 ENCOUNTER — Encounter (HOSPITAL_COMMUNITY): Payer: Self-pay | Admitting: Emergency Medicine

## 2018-10-23 ENCOUNTER — Other Ambulatory Visit: Payer: Self-pay

## 2018-10-23 DIAGNOSIS — H66002 Acute suppurative otitis media without spontaneous rupture of ear drum, left ear: Secondary | ICD-10-CM | POA: Diagnosis not present

## 2018-10-23 MED ORDER — AMOXICILLIN 875 MG PO TABS: 875.0000 mg | ORAL_TABLET | Freq: Two times a day (BID) | ORAL | 0 refills | Status: AC

## 2018-10-23 NOTE — ED Triage Notes (Addendum)
Left ear pain, woke patient.  Patient continues to have sniffles  Last week had fever and fatigue

## 2018-10-23 NOTE — ED Provider Notes (Signed)
[facility]   [id number] 10/23/18 Arrival Time: 0957  ASSESSMENT & PLAN:  1. Non-recurrent acute suppurative otitis media of left ear without spontaneous rupture of tympanic membrane    Meds ordered this encounter  Medications  . amoxicillin (AMOXIL) 875 MG tablet    Sig: Take 1 tablet (875 mg total) by mouth 2 (two) times daily for 10 days.    Dispense:  20 tablet    Refill:  0   Discussed typical duration of symptoms. OTC symptom care as needed. Ensure adequate fluid intake and rest. May f/u with PCP or here as needed.  Reviewed expectations re: course of current medical issues. Questions answered. Outlined signs and symptoms indicating need for more acute intervention. Patient verbalized understanding. After Visit Summary given.   SUBJECTIVE: History from: patient.  Melissa Moon is a 33 y.o. female who presents with complaint of left otalgia; without drainage; without bleeding. Onset abrupt, today. Noticed after waking. Recent cold symptoms: mild and resolving. Fever: no. Some fatigue. Overall normal PO intake without n/v. Sick contacts: no. OTC treatment: none reported.  Social History   Tobacco Use  Smoking Status Never Smoker  Smokeless Tobacco Never Used   ROS: As per HPI.   OBJECTIVE:  Vitals:   10/23/18 1036  BP: 122/81  Pulse: (!) 107  Resp: 18  Temp: 98.2 F (36.8 C)  TempSrc: Oral  SpO2: 96%     General appearance: alert; appears fatigued Ear Canal: normal bilaterally TM: left: erythematous, bulging; right: normal Neck: supple without LAD Lungs: unlabored respirations, symmetrical air entry; cough: absent; no respiratory distress Skin: warm and dry Psychological: alert and cooperative; normal mood and affect  Allergies  Allergen Reactions  . Cefaclor Other (See Comments)    Happened as a child   . Tacrolimus Other (See Comments)    Happened as a child. unknown  . Latex     Past Medical History:  Diagnosis Date  .  Anxiety and depression   . Asthma   . GERD (gastroesophageal reflux disease)   . Migraine   . UC (ulcerative colitis) ([place])    Family History  Problem Relation Age of Onset  . Pancreatic cancer Mother        Deceased at age 13  . Diabetes Mother   . Kidney disease Mother   . Liver disease Father   . COPD Father   . Heart attack Father        deceased at age 30  . Breast cancer Sister    Social History   Socioeconomic History  . Marital status: Married    Spouse name: Not on file  . Number of children: 1  . Years of education: Not on file  . Highest education level: Not on file  Occupational History  . Occupation: Actor  Social Needs  . Financial resource strain: Not on file  . Food insecurity:    Worry: Not on file    Inability: Not on file  . Transportation needs:    Medical: Not on file    Non-medical: Not on file  Tobacco Use  . Smoking status: Never Smoker  . Smokeless tobacco: Never Used  Substance and Sexual Activity  . Alcohol use: No  . Drug use: No  . Sexual activity: Yes    Partners: Male    Birth control/protection: Implant  Lifestyle  . Physical activity:    Days per week: Not on file    Minutes per session: Not on file  .  Stress: Not on file  Relationships  . Social connections:    Talks on phone: Not on file    Gets together: Not on file    Attends religious service: Not on file    Active member of club or organization: Not on file    Attends meetings of clubs or organizations: Not on file    Relationship status: Not on file  . Intimate partner violence:    Fear of current or ex partner: Not on file    Emotionally abused: Not on file    Physically abused: Not on file    Forced sexual activity: Not on file  Other Topics Concern  . Not on file  Social History Narrative   Never married. 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             Vanessa Kick, MD 10/23/18 1308

## 2018-10-23 NOTE — Discharge Instructions (Addendum)
You may use over the counter ibuprofen or acetaminophen as needed.

## 2018-10-28 ENCOUNTER — Ambulatory Visit: Payer: Self-pay | Admitting: Gastroenterology

## 2018-10-28 NOTE — Telephone Encounter (Signed)
Error

## 2018-11-08 DIAGNOSIS — R1900 Intra-abdominal and pelvic swelling, mass and lump, unspecified site: Secondary | ICD-10-CM | POA: Diagnosis not present

## 2018-11-08 DIAGNOSIS — N736 Female pelvic peritoneal adhesions (postinfective): Secondary | ICD-10-CM | POA: Diagnosis not present

## 2018-11-08 DIAGNOSIS — Z302 Encounter for sterilization: Secondary | ICD-10-CM | POA: Diagnosis not present

## 2018-11-08 DIAGNOSIS — N838 Other noninflammatory disorders of ovary, fallopian tube and broad ligament: Secondary | ICD-10-CM | POA: Diagnosis not present

## 2018-11-11 DIAGNOSIS — L282 Other prurigo: Secondary | ICD-10-CM | POA: Diagnosis not present

## 2018-11-11 DIAGNOSIS — R220 Localized swelling, mass and lump, head: Secondary | ICD-10-CM | POA: Diagnosis not present

## 2018-11-15 DIAGNOSIS — L282 Other prurigo: Secondary | ICD-10-CM | POA: Diagnosis not present

## 2018-11-25 DIAGNOSIS — J4551 Severe persistent asthma with (acute) exacerbation: Secondary | ICD-10-CM | POA: Diagnosis not present

## 2018-12-04 DIAGNOSIS — L0390 Cellulitis, unspecified: Secondary | ICD-10-CM | POA: Diagnosis not present

## 2019-01-21 DIAGNOSIS — J4551 Severe persistent asthma with (acute) exacerbation: Secondary | ICD-10-CM | POA: Diagnosis not present

## 2019-02-13 DIAGNOSIS — L658 Other specified nonscarring hair loss: Secondary | ICD-10-CM | POA: Diagnosis not present

## 2019-02-13 DIAGNOSIS — R238 Other skin changes: Secondary | ICD-10-CM | POA: Diagnosis not present

## 2019-02-20 DIAGNOSIS — J4551 Severe persistent asthma with (acute) exacerbation: Secondary | ICD-10-CM | POA: Diagnosis not present

## 2019-02-20 DIAGNOSIS — J301 Allergic rhinitis due to pollen: Secondary | ICD-10-CM | POA: Diagnosis not present

## 2019-02-20 DIAGNOSIS — J302 Other seasonal allergic rhinitis: Secondary | ICD-10-CM | POA: Diagnosis not present

## 2019-02-20 DIAGNOSIS — J3089 Other allergic rhinitis: Secondary | ICD-10-CM | POA: Diagnosis not present

## 2019-02-20 DIAGNOSIS — Z91018 Allergy to other foods: Secondary | ICD-10-CM | POA: Diagnosis not present

## 2019-02-20 DIAGNOSIS — J3081 Allergic rhinitis due to animal (cat) (dog) hair and dander: Secondary | ICD-10-CM | POA: Diagnosis not present

## 2019-02-20 DIAGNOSIS — J329 Chronic sinusitis, unspecified: Secondary | ICD-10-CM | POA: Diagnosis not present

## 2019-03-04 DIAGNOSIS — J4550 Severe persistent asthma, uncomplicated: Secondary | ICD-10-CM | POA: Diagnosis not present

## 2019-03-04 DIAGNOSIS — J329 Chronic sinusitis, unspecified: Secondary | ICD-10-CM | POA: Diagnosis not present

## 2019-03-04 DIAGNOSIS — J302 Other seasonal allergic rhinitis: Secondary | ICD-10-CM | POA: Diagnosis not present

## 2019-03-04 DIAGNOSIS — Z91018 Allergy to other foods: Secondary | ICD-10-CM | POA: Diagnosis not present

## 2019-03-20 DIAGNOSIS — J4550 Severe persistent asthma, uncomplicated: Secondary | ICD-10-CM | POA: Diagnosis not present

## 2019-05-15 DIAGNOSIS — J4550 Severe persistent asthma, uncomplicated: Secondary | ICD-10-CM | POA: Diagnosis not present

## 2019-05-16 IMAGING — CT CT ENTEROGRAPHY (ABD-PELV W/ CM)
2 of 5 series · 15 of 46 positions shown, 17 images · IV contrast (ISOVUE 300)
Comparison: None.

CLINICAL DATA: Ulcerative colitis. Chronic diarrhea. Stool and gas
from vagina for 5 months. Suspected fistula.

EXAM:
CT ABDOMEN AND PELVIS WITH CONTRAST (ENTEROGRAPHY)
TECHNIQUE: Multidetector CT of the abdomen and pelvis during bolus
administration of intravenous contrast. Negative oral contrast was
given.
CONTRAST:  100mL [id number]-QNN IOPAMIDOL ([id number]-QNN) INJECTION 61%

[Series 4: entero thins · axial · 0.78mm/px · z∈[-530,-70]mm · 12 of 256 slices shown, 14 images]
[im 13/256  soft-tissue]
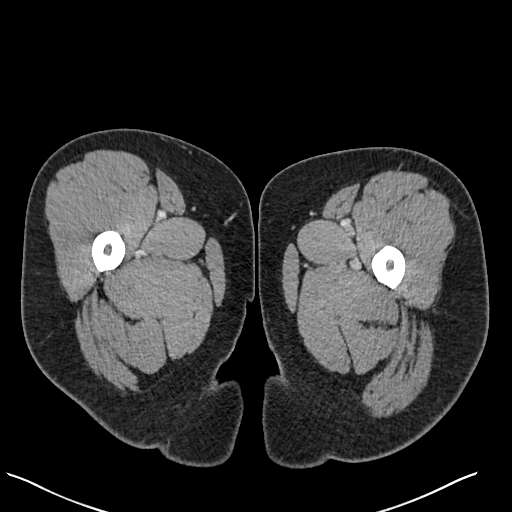
[im 13/256  bone]
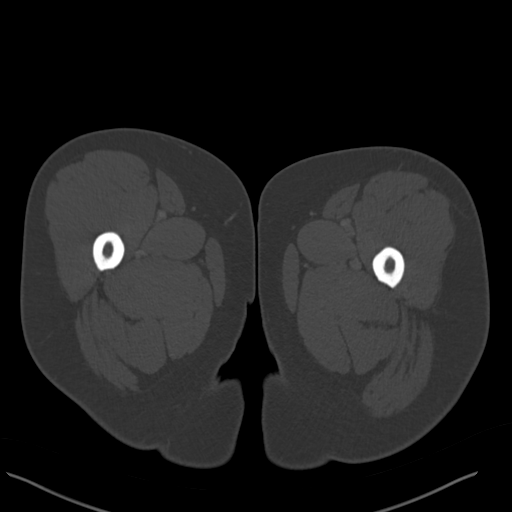
[im 39/256  soft-tissue]
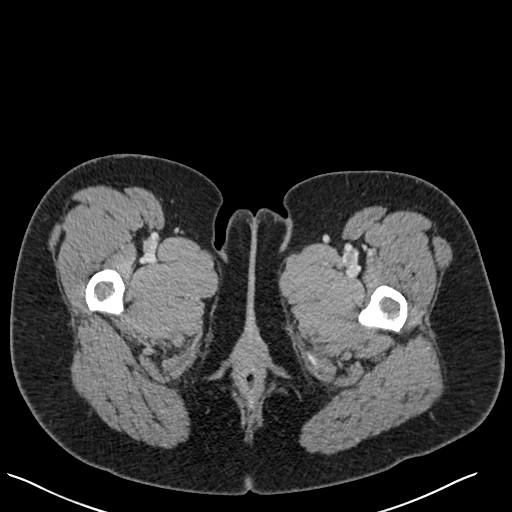
[im 52/256  soft-tissue]
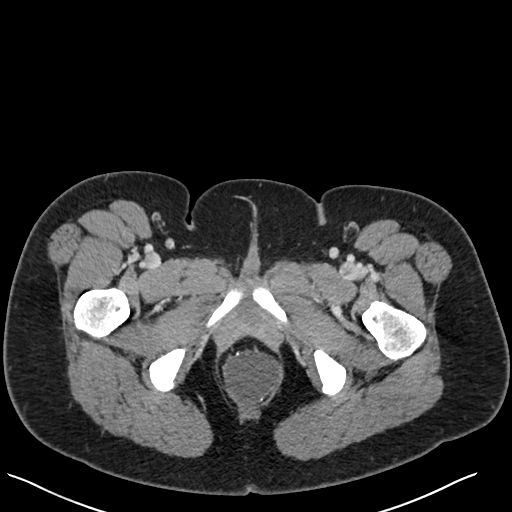
[im 77/256  soft-tissue]
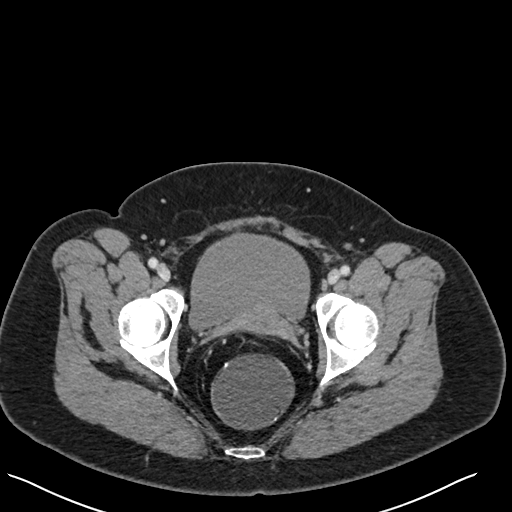
[im 103/256  soft-tissue]
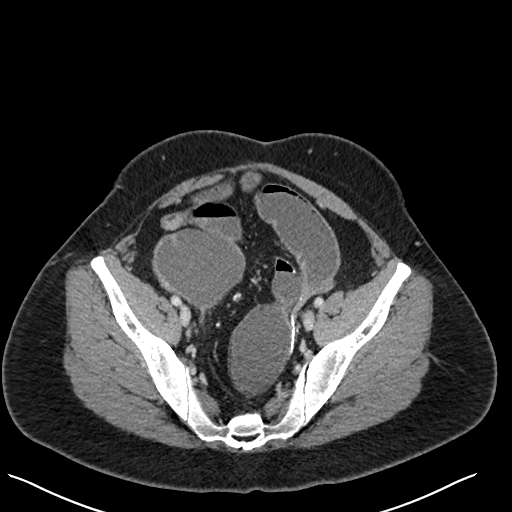
[im 115/256  soft-tissue]
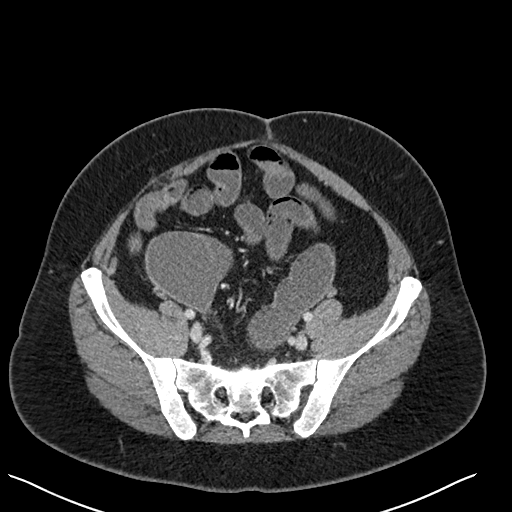
[im 141/256  soft-tissue]
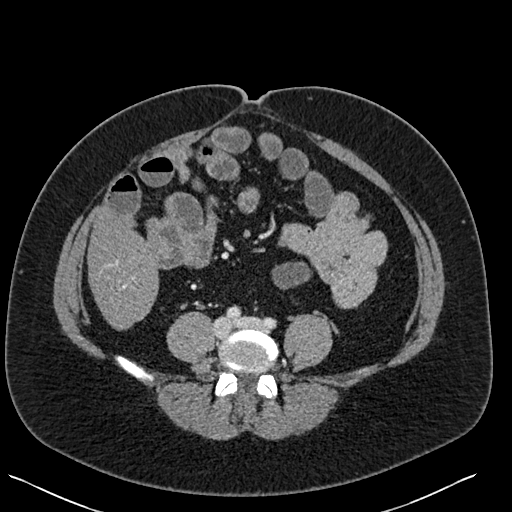
[im 154/256  soft-tissue]
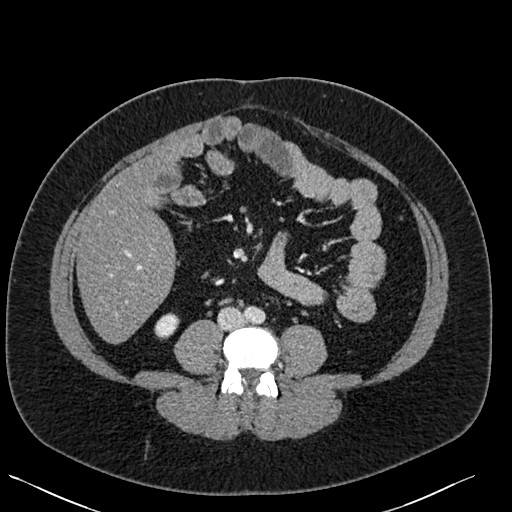
[im 179/256  soft-tissue]
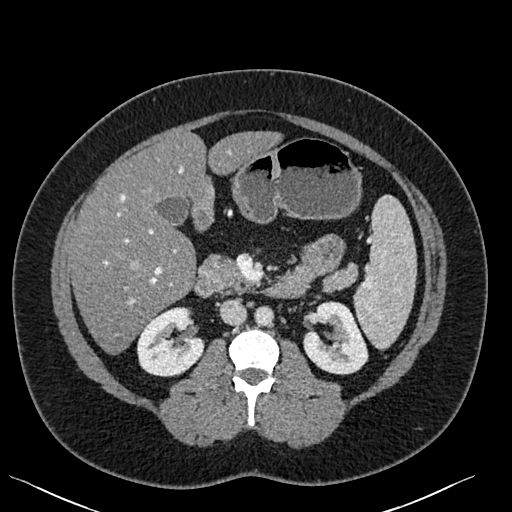
[im 179/256  bone]
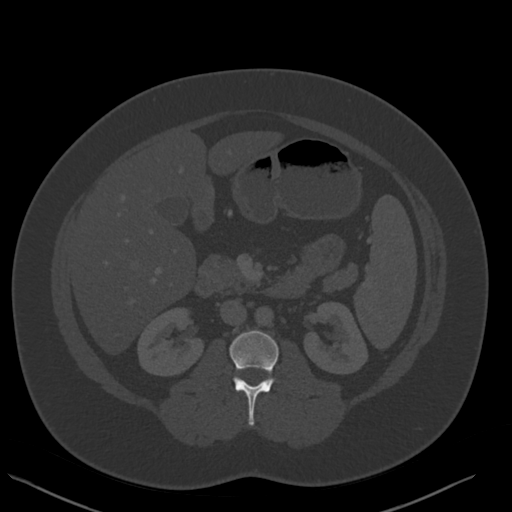
[im 205/256  soft-tissue]
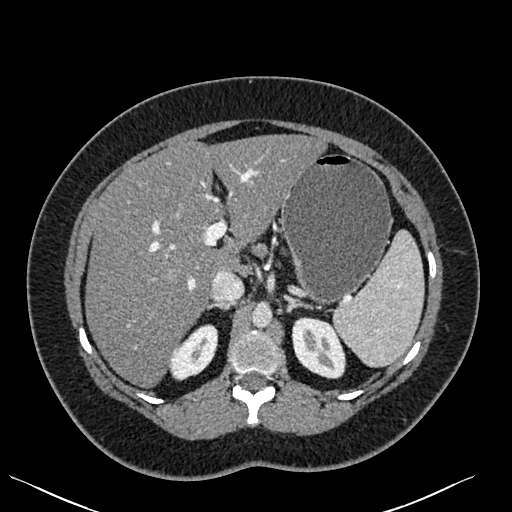
[im 217/256  soft-tissue]
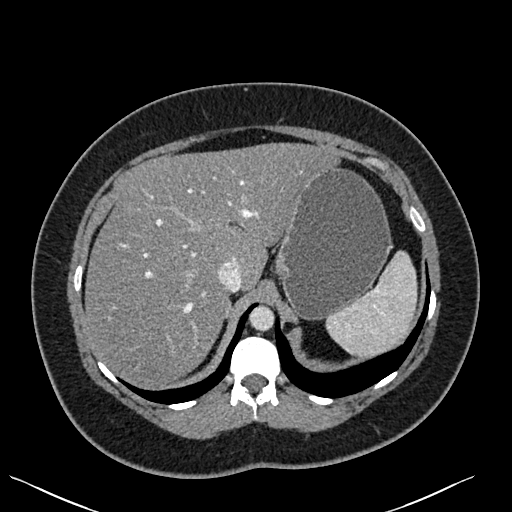
[im 243/256  soft-tissue]
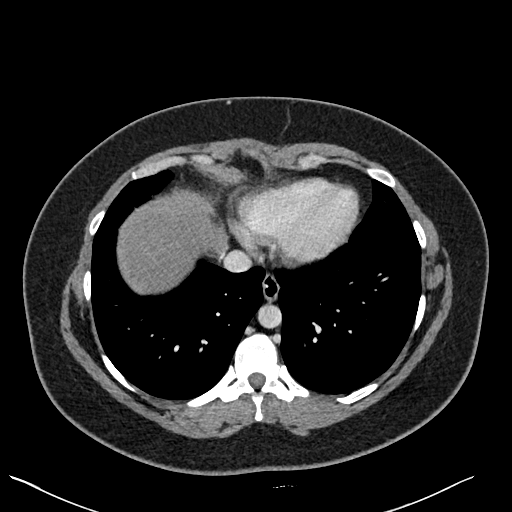

[Series 6: coronal · coronal · 0.72mm/px · 3 of 89 slices shown]
[im 30/89  soft-tissue]
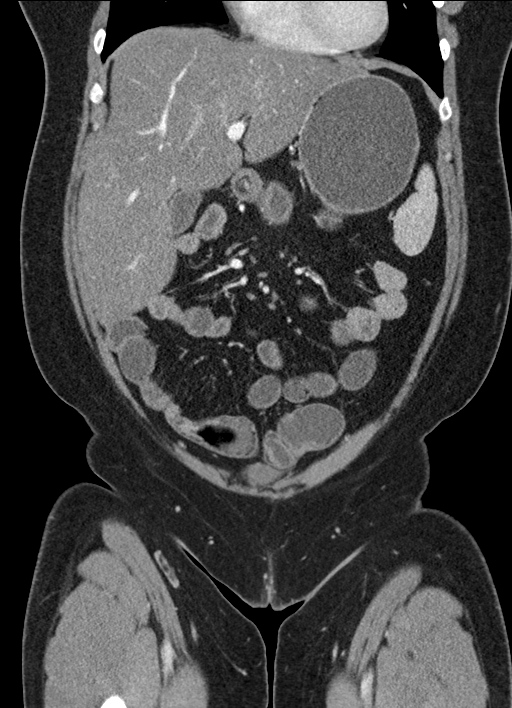
[im 40/89  soft-tissue]
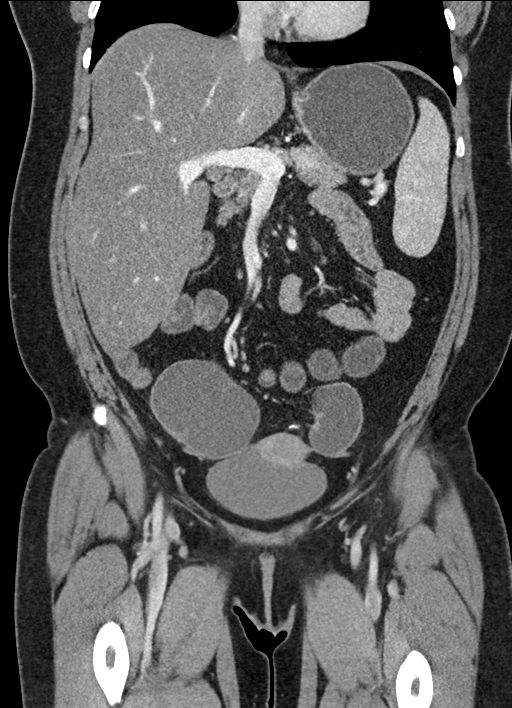
[im 49/89  soft-tissue]
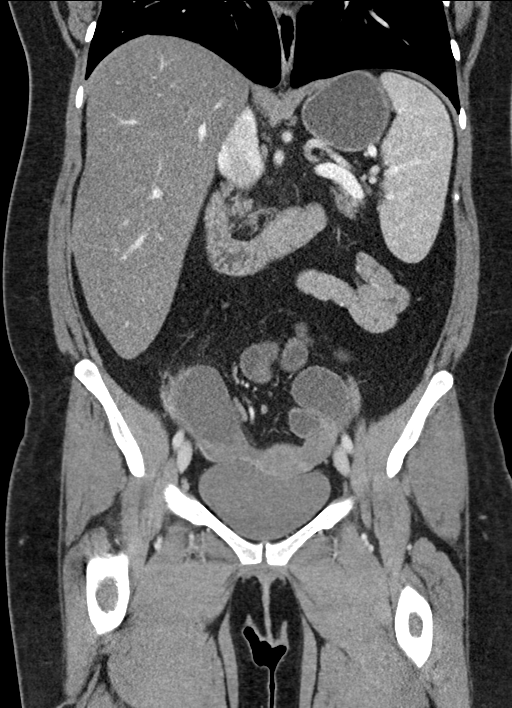

[15 of 46 positions shown; findings below may reference images not displayed]

FINDINGS: Lower Chest: No acute findings.

Hepatobiliary: No hepatic masses identified. Moderate to severe
steatosis. Gallbladder is unremarkable. No evidence of biliary
ductal dilatation.

Pancreas:  No mass or inflammatory changes.

Spleen: Within normal limits in size and appearance.

Adrenals/Urinary Tract: No masses identified. No evidence of
hydronephrosis. Unremarkable unopacified urinary bladder. No
evidence of gas within the bladder lumen.

Stomach/Bowel: Postop changes from total colectomy with ileoanal
pull-through. Mild-to-moderate dilatation of distal small bowel
loops is seen to the level of anus, without evidence of obstruction.
A cystic lesion is seen in the right pelvis which measures 7.8 x
cm. This shows no definite connection to bowel, and differential
diagnosis includes a cystic ovarian lesion, hydrosalpinx, and
peritoneal inclusion cyst. No evidence of surrounding inflammatory
changes or free fluid.

Vascular/Lymphatic: No pathologically enlarged lymph nodes. No
abdominal aortic aneurysm.

Reproductive: 8 cm cystic lesion in right pelvis, as described in
bowel section above. Uterus and cervix are normal appearance. No gas
seen within the endometrial cavity or vagina.

Other:  None.

Musculoskeletal:  No suspicious bone lesions identified.
IMPRESSION: Previous total colectomy. No radiographic evidence of active
inflammatory bowel disease, fistula, or abscess.

8 cm benign-appearing cystic lesion in right pelvis, without
definite connection to bowel. Differential diagnosis includes cystic
ovarian lesion, hydrosalpinx, and peritoneal inclusion cyst.

Moderate to severe hepatic steatosis.

## 2019-06-19 DIAGNOSIS — F411 Generalized anxiety disorder: Secondary | ICD-10-CM | POA: Diagnosis not present

## 2019-06-19 DIAGNOSIS — F4010 Social phobia, unspecified: Secondary | ICD-10-CM | POA: Diagnosis not present

## 2019-06-19 DIAGNOSIS — F410 Panic disorder [episodic paroxysmal anxiety] without agoraphobia: Secondary | ICD-10-CM | POA: Diagnosis not present

## 2019-06-19 DIAGNOSIS — F3181 Bipolar II disorder: Secondary | ICD-10-CM | POA: Diagnosis not present

## 2019-07-03 DIAGNOSIS — F410 Panic disorder [episodic paroxysmal anxiety] without agoraphobia: Secondary | ICD-10-CM | POA: Diagnosis not present

## 2019-07-03 DIAGNOSIS — F4010 Social phobia, unspecified: Secondary | ICD-10-CM | POA: Diagnosis not present

## 2019-07-03 DIAGNOSIS — F411 Generalized anxiety disorder: Secondary | ICD-10-CM | POA: Diagnosis not present

## 2019-07-03 DIAGNOSIS — F3181 Bipolar II disorder: Secondary | ICD-10-CM | POA: Diagnosis not present

## 2019-07-08 DIAGNOSIS — F411 Generalized anxiety disorder: Secondary | ICD-10-CM | POA: Diagnosis not present

## 2019-07-08 DIAGNOSIS — F410 Panic disorder [episodic paroxysmal anxiety] without agoraphobia: Secondary | ICD-10-CM | POA: Diagnosis not present

## 2019-07-08 DIAGNOSIS — F3181 Bipolar II disorder: Secondary | ICD-10-CM | POA: Diagnosis not present

## 2019-07-08 DIAGNOSIS — F4010 Social phobia, unspecified: Secondary | ICD-10-CM | POA: Diagnosis not present

## 2019-07-10 DIAGNOSIS — J4550 Severe persistent asthma, uncomplicated: Secondary | ICD-10-CM | POA: Diagnosis not present

## 2019-07-31 DIAGNOSIS — J301 Allergic rhinitis due to pollen: Secondary | ICD-10-CM | POA: Diagnosis not present

## 2019-07-31 DIAGNOSIS — J4550 Severe persistent asthma, uncomplicated: Secondary | ICD-10-CM | POA: Diagnosis not present

## 2019-07-31 DIAGNOSIS — J329 Chronic sinusitis, unspecified: Secondary | ICD-10-CM | POA: Diagnosis not present

## 2019-07-31 DIAGNOSIS — J302 Other seasonal allergic rhinitis: Secondary | ICD-10-CM | POA: Diagnosis not present

## 2019-07-31 DIAGNOSIS — J3081 Allergic rhinitis due to animal (cat) (dog) hair and dander: Secondary | ICD-10-CM | POA: Diagnosis not present

## 2019-07-31 DIAGNOSIS — J3089 Other allergic rhinitis: Secondary | ICD-10-CM | POA: Diagnosis not present

## 2019-07-31 DIAGNOSIS — Z91018 Allergy to other foods: Secondary | ICD-10-CM | POA: Diagnosis not present

## 2019-08-05 DIAGNOSIS — J302 Other seasonal allergic rhinitis: Secondary | ICD-10-CM | POA: Diagnosis not present

## 2019-08-05 DIAGNOSIS — J3089 Other allergic rhinitis: Secondary | ICD-10-CM | POA: Diagnosis not present

## 2019-08-05 DIAGNOSIS — J3081 Allergic rhinitis due to animal (cat) (dog) hair and dander: Secondary | ICD-10-CM | POA: Diagnosis not present

## 2019-08-05 DIAGNOSIS — J329 Chronic sinusitis, unspecified: Secondary | ICD-10-CM | POA: Diagnosis not present

## 2019-08-05 DIAGNOSIS — J301 Allergic rhinitis due to pollen: Secondary | ICD-10-CM | POA: Diagnosis not present

## 2019-08-05 DIAGNOSIS — Z91018 Allergy to other foods: Secondary | ICD-10-CM | POA: Diagnosis not present

## 2019-08-05 DIAGNOSIS — J4550 Severe persistent asthma, uncomplicated: Secondary | ICD-10-CM | POA: Diagnosis not present

## 2019-08-14 DIAGNOSIS — F410 Panic disorder [episodic paroxysmal anxiety] without agoraphobia: Secondary | ICD-10-CM | POA: Diagnosis not present

## 2019-08-14 DIAGNOSIS — F411 Generalized anxiety disorder: Secondary | ICD-10-CM | POA: Diagnosis not present

## 2019-08-14 DIAGNOSIS — F3181 Bipolar II disorder: Secondary | ICD-10-CM | POA: Diagnosis not present

## 2019-08-14 DIAGNOSIS — F4010 Social phobia, unspecified: Secondary | ICD-10-CM | POA: Diagnosis not present

## 2019-08-15 ENCOUNTER — Encounter: Payer: Self-pay | Admitting: Gastroenterology

## 2019-09-02 DIAGNOSIS — J4550 Severe persistent asthma, uncomplicated: Secondary | ICD-10-CM | POA: Diagnosis not present

## 2019-09-22 DIAGNOSIS — F410 Panic disorder [episodic paroxysmal anxiety] without agoraphobia: Secondary | ICD-10-CM | POA: Diagnosis not present

## 2019-09-22 DIAGNOSIS — F4010 Social phobia, unspecified: Secondary | ICD-10-CM | POA: Diagnosis not present

## 2019-09-22 DIAGNOSIS — F411 Generalized anxiety disorder: Secondary | ICD-10-CM | POA: Diagnosis not present

## 2019-09-22 DIAGNOSIS — F3181 Bipolar II disorder: Secondary | ICD-10-CM | POA: Diagnosis not present

## 2020-03-29 ENCOUNTER — Other Ambulatory Visit: Payer: Self-pay

## 2020-03-29 ENCOUNTER — Encounter: Payer: Self-pay | Admitting: Internal Medicine

## 2020-03-29 ENCOUNTER — Ambulatory Visit: Payer: BLUE CROSS/BLUE SHIELD | Admitting: Internal Medicine

## 2020-03-29 VITALS — BP 140/100 | HR 122 | Temp 98.3°F | Ht 64.0 in | Wt 195.6 lb

## 2020-03-29 DIAGNOSIS — R0689 Other abnormalities of breathing: Secondary | ICD-10-CM

## 2020-03-29 DIAGNOSIS — R0600 Dyspnea, unspecified: Secondary | ICD-10-CM

## 2020-03-29 DIAGNOSIS — Z8709 Personal history of other diseases of the respiratory system: Secondary | ICD-10-CM

## 2020-03-29 DIAGNOSIS — R062 Wheezing: Secondary | ICD-10-CM | POA: Diagnosis not present

## 2020-03-29 DIAGNOSIS — R05 Cough: Secondary | ICD-10-CM | POA: Diagnosis not present

## 2020-03-29 DIAGNOSIS — R053 Chronic cough: Secondary | ICD-10-CM

## 2020-03-29 MED ORDER — BREO ELLIPTA 200-25 MCG/INH IN AEPB: 1.0000 | INHALATION_SPRAY | Freq: Every day | RESPIRATORY_TRACT | 0 refills | Status: DC

## 2020-03-29 MED ORDER — PREDNISONE 5 MG (21) PO TBPK
ORAL_TABLET | ORAL | 0 refills | Status: AC
Start: 2020-03-29 — End: 2020-04-06

## 2020-03-29 NOTE — Patient Instructions (Addendum)
ICD-10-CM   1. Dyspnea and respiratory abnormalities  R06.00    R06.89   2. Wheezing  R06.2   3. Chronic cough  R05   4. 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

## 2020-03-29 NOTE — Addendum Note (Signed)
Addended by: Suzzanne Cloud E on: 03/29/2020 03:26 PM   Modules accepted: Orders

## 2020-03-29 NOTE — Addendum Note (Signed)
Addended by: Vanessa Barbara on: 03/29/2020 04:12 PM   Modules accepted: Orders

## 2020-03-29 NOTE — Addendum Note (Signed)
Addended by: Vanessa Barbara on: 03/29/2020 03:24 PM   Modules accepted: Orders

## 2020-03-29 NOTE — Progress Notes (Signed)
OV 03/29/2020  Subjective:  Patient ID: Melissa Moon, female , DOB: [birth year] , age 34 y.o. , MRN: [id number] , ADDRESS: [place]. [place] [address]   03/29/2020 -   Chief Complaint  Patient presents with  . Consult    asthma as a child.  sob all the time.  low wheezing.  productive cough, beigh color.     HPI Melissa Moon 34 y.o. -34 year old female.  Used to be followed by Dr. Camelia Eng in 2018.  Has been more than 3 years left of the new visit.  She says she has a lifelong history of asthma.  She feels the asthma is worse in the last few years.  She says she constantly lives with chest tightness, wheezing and shortness of breath.  The symptoms have slowly progressed.  She wakes up few times a week at night with coughing.  She used to be on Fasenra but it did not help.  She was seeing an allergist in [place] but apparently they only doing virtual visits therefore she decided to come back here.  She used to be on Symbicort that helped somewhat she was also taking it with Singulair.  But then the allergist changed her to Qvar which is not helping.  She reports 5 prednisone burst in the year 2020 and at least 3 prednisone burst so far in the year 2021.  He does not been to emergency room for this but has been to urgent care.  No hospitalizations no intubations in the past.  No history of exposure to cockroaches or dust mite or mold inside the house.  She does not smoke.  She has ulcerative colitis and is on medications for that listed below.  She recently had a chest x-ray in the last couple of months which was upper lobe infiltrates.  This was done at the outside facility only saw the report.  I do not have the images.  ACT score is 7 showing significant amount of symptom burden.  She feels she can benefit from another prednisone right now and will only be able to hold off for a few days.      Asthma Control Test ACT Total Score  03/29/2020 7    Asthma  Control Panel 03/29/2020   Current Med Regimen qvar  ACT - a [employer] test - 4 week. Total score. Max is 25, Lower score is worse.  <19 = poor control 7  FeNO ppB x  FeV1  x  Planned intervention  for visit pred taper and start breo. Do CT and PFT and feno      ROS - per HPI     has a past medical history of Allergies, Anxiety and depression, Asthma, GERD (gastroesophageal reflux disease), Migraine, and UC (ulcerative colitis) ([place]).   reports that she has never smoked. She has never used smokeless tobacco.  Past Surgical History:  Procedure Laterality Date  . CESAREAN SECTION  2013  . PARTIAL COLECTOMY  2000/01    Allergies  Allergen Reactions  . Cefaclor Other (See Comments)    Happened as a child   . Tacrolimus Other (See Comments)    Happened as a child. unknown  . Latex     Immunization History  Administered Date(s) Administered  . PFIZER SARS-COV-2 Vaccination 01/21/2020, 02/04/2020    Family History  Problem Relation Age of Onset  . Pancreatic cancer Mother        Deceased at age 31  . Diabetes  Mother   . Kidney disease Mother   . Liver disease Father   . COPD Father   . Heart attack Father        deceased at age 2  . Breast cancer Sister      Current Outpatient Medications:  .  albuterol (PROVENTIL HFA;VENTOLIN HFA) 108 (90 Base) MCG/ACT inhaler, Inhale 2 puffs into the lungs every 6 (six) hours as needed for wheezing or shortness of breath., Disp: , Rfl:  .  beclomethasone (QVAR) 80 MCG/ACT inhaler, Inhale 1 puff into the lungs at bedtime., Disp: , Rfl:  .  cetirizine (ZYRTEC) 10 MG tablet, Take 10 mg by mouth at bedtime., Disp: , Rfl:  .  etonogestrel (NEXPLANON) 68 MG IMPL implant, 1 each by Subdermal route once. , Disp: , Rfl:  .  mesalamine (CANASA) 1000 MG suppository, Place 1 suppository (1,000 mg total) rectally at bedtime., Disp: 14 suppository, Rfl: 0 .  mirtazapine (REMERON) 15 MG tablet, Take 15 mg by mouth at bedtime., Disp: , Rfl:  .   montelukast (SINGULAIR) 10 MG tablet, Take 10 mg by mouth at bedtime., Disp: , Rfl:  .  SPIRIVA RESPIMAT 1.25 MCG/ACT AERS, SMARTSIG:2 Inhalation Via Inhaler Daily, Disp: , Rfl:  .  Benralizumab (FASENRA [place]), Inject 1 Syringe into the skin every 30 (thirty) days. Every 8 weeks after 3 months, Disp: , Rfl:  .  eszopiclone (LUNESTA) 2 MG TABS tablet, Take 2 mg by mouth at bedtime., Disp: , Rfl:  .  levalbuterol (XOPENEX) 1.25 MG/0.5ML nebulizer solution, Take 1.25 mg by nebulization every 4 (four) hours as needed for wheezing or shortness of breath., Disp: , Rfl:       Objective:   Vitals:   03/29/20 1435  BP: (!) 140/100  Pulse: (!) 122  Temp: 98.3 F (36.8 C)  TempSrc: Oral  SpO2: 96%  Weight: 195 lb 9.6 oz (88.7 kg)  Height: 5' 4"  (1.626 m)    Estimated body mass index is 33.57 kg/m as calculated from the following:   Height as of this encounter: 5' 4"  (1.626 m).   Weight as of this encounter: 195 lb 9.6 oz (88.7 kg).  @WEIGHTCHANGE @  [employer]   03/29/20 1435  Weight: 195 lb 9.6 oz (88.7 kg)     Physical Exam  General Appearance:    Alert, cooperative, no distress, appears stated age - yes , Deconditioned looking - no , OBESE  - ye, Sitting on Wheelchair -  no  Head:    Normocephalic, without obvious abnormality, atraumatic  Eyes:    PERRL, conjunctiva/corneas clear,  Ears:    Normal TM's and external ear canals, both ears  Nose:   Nares normal, septum midline, mucosa normal, no drainage    or sinus tenderness. OXYGEN ON  - no . Patient is @ no   Throat:   Lips, mucosa, and tongue normal; teeth and gums normal. Cyanosis on lips - no  Neck:   Supple, symmetrical, trachea midline, no adenopathy;    thyroid:  no enlargement/tenderness/nodules; no carotid   bruit or JVD  Back:     Symmetric, no curvature, ROM normal, no CVA tenderness  Lungs:     Distress - no , Wheeze YES - scatterd, diffused, end epiratior, Barrell Chest - no, Purse lip breathing - no, Crackles - no    Chest Wall:    No tenderness or deformity.    Heart:    Regular rate and rhythm, S1 and S2 normal, no rub  or gallop, Murmur - noo  Breast Exam:    NOT DONE  Abdomen:     Soft, non-tender, bowel sounds active all four quadrants,    no masses, no organomegaly. Visceral obesity - yes  Genitalia:   NOT DONE  Rectal:   NOT DONE  Extremities:   Extremities - normal, Has Cane - no, Clubbing - no, Edema - no  Pulses:   2+ and symmetric all extremities  Skin:   Stigmata of Connective Tissue Disease - no  Lymph nodes:   Cervical, supraclavicular, and axillary nodes normal  Psychiatric:  Neurologic:   Pleasant - yes, Anxious - no, Flat affect - no  CAm-ICU - neg, Alert and Oriented x 3 - yes, Moves all 4s - yes, Speech - normal, Cognition - intact           Assessment:       ICD-10-CM   1. Dyspnea and respiratory abnormalities  R06.00 IgE   R06.89 CT Chest High Resolution    Pulmonary Function Test    CANCELED: Pulmonary Function Test  2. Wheezing  R06.2 IgE    CT Chest High Resolution  3. Chronic cough  R05 IgE    CT Chest High Resolution    Pulmonary Function Test    CANCELED: Pulmonary Function Test  4. History of asthma  Z87.09 IgE    CT Chest High Resolution    Pulmonary Function Test    CANCELED: Pulmonary Function Test       Plan:     Patient Instructions     ICD-10-CM   1. Dyspnea and respiratory abnormalities  R06.00    R06.89   2. Wheezing  R06.2   3. Chronic cough  R05   4. 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 MurBrand Males.D., F.C.C.P,  Pulmonary and Critical Care Medicine Staff Physician, [facility] - Interstitial Lung Disease  Program  Pulmonary [id number]: [phone] no answer or between  15:00h - 7:00h: call [phone] Telephone: [phone]  3:21 PM 03/29/2020

## 2020-03-30 LAB — CBC WITH DIFFERENTIAL/PLATELET
Basophils Absolute: 0.1 10*3/uL (ref 0.0–0.1)
Basophils Relative: 1.1 % (ref 0.0–3.0)
Eosinophils Absolute: 1.5 10*3/uL — ABNORMAL HIGH (ref 0.0–0.7)
Eosinophils Relative: 12.7 % — ABNORMAL HIGH (ref 0.0–5.0)
HCT: 44.1 % (ref 36.0–46.0)
Hemoglobin: 15 g/dL (ref 12.0–15.0)
Lymphocytes Relative: 24 % (ref 12.0–46.0)
Lymphs Abs: 2.8 10*3/uL (ref 0.7–4.0)
MCHC: 34 g/dL (ref 30.0–36.0)
MCV: 90.3 fl (ref 78.0–100.0)
Monocytes Absolute: 0.6 10*3/uL (ref 0.1–1.0)
Monocytes Relative: 5.1 % (ref 3.0–12.0)
Neutro Abs: 6.7 10*3/uL (ref 1.4–7.7)
Neutrophils Relative %: 57.1 % (ref 43.0–77.0)
Platelets: 241 10*3/uL (ref 150.0–400.0)
RBC: 4.88 Mil/uL (ref 3.87–5.11)
RDW: 12.7 % (ref 11.5–15.5)
WBC: 11.7 10*3/uL — ABNORMAL HIGH (ref 4.0–10.5)

## 2020-03-30 LAB — RESPIRATORY ALLERGY PROFILE REGION II ~~LOC~~
Allergen, A. alternata, m6: <0.1 kU/L
Allergen, Cedar tree, t12: <0.1 kU/L
Allergen, Comm Silver Birch, t9: 1.54 kU/L — ABNORMAL HIGH
Allergen, Cottonwood, t14: <0.1 kU/L
Allergen, D pternoyssinus,d7: <0.1 kU/L
Allergen, Mouse Urine Protein, e78: <0.1 kU/L
Allergen, Mulberry, t76: <0.1 kU/L
Allergen, Oak,t7: 0.68 kU/L — ABNORMAL HIGH
Allergen, P. notatum, m1: <0.1 kU/L
Aspergillus fumigatus, m3: <0.1 kU/L
Bermuda Grass: 0.55 kU/L — ABNORMAL HIGH
Box Elder IgE: 0.21 kU/L — ABNORMAL HIGH
CLADOSPORIUM HERBARUM (M2) IGE: <0.1 kU/L
COMMON RAGWEED (SHORT) (W1) IGE: 5.94 kU/L — ABNORMAL HIGH
Cat Dander: <0.1 kU/L
Class: 0
Class: 0
Class: 0
Class: 0
Class: 0
Class: 0
Class: 0
Class: 0
Class: 0
Class: 0
Class: 0
Class: 0
Class: 0
Class: 1
Class: 1
Class: 1
Class: 2
Class: 2
Class: 3
Class: 3
Class: 3
Cockroach: 0.12 kU/L — ABNORMAL HIGH
D. farinae: <0.1 kU/L
Dog Dander: <0.1 kU/L
Elm IgE: 1.58 kU/L — ABNORMAL HIGH
IgE (Immunoglobulin E), Serum: 133 kU/L — ABNORMAL HIGH (ref <=114)
Johnson Grass: 0.69 kU/L — ABNORMAL HIGH
Pecan/Hickory Tree IgE: 11.5 kU/L — ABNORMAL HIGH
Rough Pigweed  IgE: 0.3 kU/L — ABNORMAL HIGH
Sheep Sorrel IgE: <0.1 kU/L
Timothy Grass: 4.21 kU/L — ABNORMAL HIGH

## 2020-03-30 LAB — INTERPRETATION:

## 2020-04-05 ENCOUNTER — Other Ambulatory Visit: Payer: Self-pay

## 2020-04-05 ENCOUNTER — Ambulatory Visit (INDEPENDENT_AMBULATORY_CARE_PROVIDER_SITE_OTHER)
Admission: RE | Admit: 2020-04-05 | Discharge: 2020-04-05 | Disposition: A | Discharge status: Home / Self Care | Payer: Commercial Managed Care - PPO | Source: Ambulatory Visit | Attending: Internal Medicine | Admitting: Internal Medicine

## 2020-04-05 DIAGNOSIS — R05 Cough: Secondary | ICD-10-CM

## 2020-04-05 DIAGNOSIS — R0689 Other abnormalities of breathing: Secondary | ICD-10-CM | POA: Diagnosis not present

## 2020-04-05 DIAGNOSIS — R062 Wheezing: Secondary | ICD-10-CM | POA: Diagnosis not present

## 2020-04-05 DIAGNOSIS — R053 Chronic cough: Secondary | ICD-10-CM

## 2020-04-05 DIAGNOSIS — R0600 Dyspnea, unspecified: Secondary | ICD-10-CM

## 2020-04-05 DIAGNOSIS — Z8709 Personal history of other diseases of the respiratory system: Secondary | ICD-10-CM

## 2020-04-14 ENCOUNTER — Telehealth: Payer: Self-pay | Admitting: Internal Medicine

## 2020-04-14 DIAGNOSIS — R062 Wheezing: Secondary | ICD-10-CM

## 2020-04-14 DIAGNOSIS — R0600 Dyspnea, unspecified: Secondary | ICD-10-CM

## 2020-04-14 DIAGNOSIS — Z8709 Personal history of other diseases of the respiratory system: Secondary | ICD-10-CM

## 2020-04-14 DIAGNOSIS — R053 Chronic cough: Secondary | ICD-10-CM

## 2020-04-14 DIAGNOSIS — R0689 Other abnormalities of breathing: Secondary | ICD-10-CM

## 2020-04-14 NOTE — Telephone Encounter (Signed)
Called and spoke with pt. Pt is requesting to know the results of recent labwork and CT scan. MR, please advise.

## 2020-04-17 NOTE — Telephone Encounter (Signed)
Allergy test positive for various environmental allergens Blood eois also high IgE elevated somewhat to reflect above  Ct chest without cancer or fibrosis or pneumonia or COPD but has some changes to reflect asthma  Plan  - overall things are fitting in with allergic asthma  - continue plan from office visit - do pft and feno and keep up with appt with TP     SIGNATURE    Dr. Brand Males, M.D., F.C.C.P,  Pulmonary and Critical Care Medicine Staff Physician, [place] Director - Interstitial Lung Disease  Program  Pulmonary [place] [facility] [place], [place], [address]  Pager: [phone], If no answer or between  15:00h - 7:00h: call [phone] Telephone: [phone]  4:23 PM 04/17/2020     Results for Melissa Moon, Melissa Moon" (MRN [id number]) as of 04/17/2020 16:20  Ref. Range 02/25/2018 03:59 02/25/2018 14:10 02/26/2018 06:40 08/26/2018 10:55 03/29/2020 15:26  Eosinophils Absolute Latest Ref Range: 0 - 0 K/uL    0.0 1.5 (H)   Results for Melissa Moon, Melissa Moon (MRN [id number]) as of 04/17/2020 16:20  Ref. Range 03/29/2020 15:26  Sheep Sorrel IgE Latest Units: kU/L <0.10  Pecan/Hickory Tree IgE Latest Units: kU/L 11.50 (H)  IgE (Immunoglobulin E), Serum Latest Ref Range: <OR=114 kU/L 133 (H)  Allergen, D pternoyssinus,d7 Latest Units: kU/L <0.10  Cat Dander Latest Units: kU/L <0.10  Dog Dander Latest Units: kU/L <0.10  [place] Grass Latest Units: kU/L 0.55 (H)  Johnson Grass Latest Units: kU/L 0.69 (H)  Timothy Grass Latest Units: kU/L 4.21 (H)  Cockroach Latest Units: kU/L 0.12 (H)  Aspergillus fumigatus, m3 Latest Units: kU/L <0.10  Allergen, Comm Silver Wendee Copp, t9 Latest Units: kU/L 1.54 (H)  Allergen, Cottonwood, t14 Latest Units: kU/L <0.10  Elm IgE Latest Units: kU/L 1.58 (H)  Allergen, Mulberry, t76 Latest Units: kU/L <0.10  Allergen, Oak,t7 Latest Units: kU/L 0.68 (H)  COMMON RAGWEED (SHORT) (W1)  IGE Latest Units: kU/L 5.94 (H)  Allergen, Mouse Urine Protein, e78 Latest Units: kU/L <0.10  D. farinae Latest Units: kU/L <0.10  Allergen, Cedar tree, t12 Latest Units: kU/L <0.10  Box Elder IgE Latest Units: kU/L 0.21 (H)  Rough Pigweed  IgE Latest Units: kU/L 0.30 (H)

## 2020-04-19 MED ORDER — BREO ELLIPTA 200-25 MCG/INH IN AEPB: 1.0000 | INHALATION_SPRAY | Freq: Every day | RESPIRATORY_TRACT | 5 refills | Status: DC

## 2020-04-19 NOTE — Telephone Encounter (Signed)
Called and spoke with pt letting her know the results of test results and stated that all will be gone over in full detail at upcoming [place]. Stated to her to keep PFT and OV with TP as scheduled and stated to her that we will also do another breathing test of Feno when she comes to see if any plan needs to be made with current regimen. Pt verbalized understanding. Order placed for feno and appt info updated with feno prior. Nothing further needed.

## 2020-05-11 ENCOUNTER — Encounter: Payer: Self-pay | Admitting: Pulmonary Disease

## 2020-05-11 ENCOUNTER — Ambulatory Visit: Payer: Commercial Managed Care - PPO | Admitting: Pulmonary Disease

## 2020-05-11 ENCOUNTER — Telehealth: Payer: Self-pay | Admitting: Pharmacy Technician

## 2020-05-11 ENCOUNTER — Other Ambulatory Visit: Payer: Self-pay

## 2020-05-11 ENCOUNTER — Ambulatory Visit (INDEPENDENT_AMBULATORY_CARE_PROVIDER_SITE_OTHER): Payer: Commercial Managed Care - PPO | Admitting: Internal Medicine

## 2020-05-11 VITALS — BP 140/90 | HR 89 | Temp 98.5°F | Ht 64.0 in | Wt 196.2 lb

## 2020-05-11 DIAGNOSIS — D721 Eosinophilia, unspecified: Secondary | ICD-10-CM | POA: Insufficient documentation

## 2020-05-11 DIAGNOSIS — R0600 Dyspnea, unspecified: Secondary | ICD-10-CM

## 2020-05-11 DIAGNOSIS — Z9109 Other allergy status, other than to drugs and biological substances: Secondary | ICD-10-CM

## 2020-05-11 DIAGNOSIS — R053 Chronic cough: Secondary | ICD-10-CM

## 2020-05-11 DIAGNOSIS — R0689 Other abnormalities of breathing: Secondary | ICD-10-CM

## 2020-05-11 DIAGNOSIS — J4550 Severe persistent asthma, uncomplicated: Secondary | ICD-10-CM | POA: Diagnosis not present

## 2020-05-11 DIAGNOSIS — D7219 Other eosinophilia: Secondary | ICD-10-CM

## 2020-05-11 DIAGNOSIS — R0602 Shortness of breath: Secondary | ICD-10-CM | POA: Diagnosis not present

## 2020-05-11 DIAGNOSIS — R768 Other specified abnormal immunological findings in serum: Secondary | ICD-10-CM | POA: Insufficient documentation

## 2020-05-11 DIAGNOSIS — R7689 Other specified abnormal immunological findings in serum: Secondary | ICD-10-CM | POA: Insufficient documentation

## 2020-05-11 DIAGNOSIS — Z8709 Personal history of other diseases of the respiratory system: Secondary | ICD-10-CM

## 2020-05-11 LAB — PULMONARY FUNCTION TEST
DL/VA % pred: 133 %
DL/VA: 6.1 ml/min/mmHg/L
DLCO cor % pred: 112 %
DLCO cor: 25.19 ml/min/mmHg
DLCO unc % pred: 117 %
DLCO unc: 26.35 ml/min/mmHg
FEF 25-75 Post: 2.79 L/sec
FEF 25-75 Pre: 2.64 L/sec
FEF2575-%Change-Post: 5 %
FEF2575-%Pred-Post: 81 %
FEF2575-%Pred-Pre: 77 %
FEV1-%Change-Post: 0 %
FEV1-%Pred-Post: 77 %
FEV1-%Pred-Pre: 77 %
FEV1-Post: 2.46 L
FEV1-Pre: 2.46 L
FEV1FVC-%Change-Post: 1 %
FEV1FVC-%Pred-Pre: 99 %
FEV6-%Change-Post: 0 %
FEV6-%Pred-Post: 78 %
FEV6-%Pred-Pre: 78 %
FEV6-Post: 2.91 L
FEV6-Pre: 2.94 L
FEV6FVC-%Pred-Post: 101 %
FEV6FVC-%Pred-Pre: 101 %
FVC-%Change-Post: 0 %
FVC-%Pred-Post: 77 %
FVC-%Pred-Pre: 78 %
FVC-Post: 2.91 L
FVC-Pre: 2.94 L
Post FEV1/FVC ratio: 84 %
Post FEV6/FVC ratio: 100 %
Pre FEV1/FVC ratio: 83 %
Pre FEV6/FVC Ratio: 100 %
RV % pred: 126 %
RV: 1.81 L
TLC % pred: 91 %
TLC: 4.61 L

## 2020-05-11 LAB — NITRIC OXIDE: Nitric Oxide: 27

## 2020-05-11 NOTE — Progress Notes (Signed)
PFT done today. 

## 2020-05-11 NOTE — Assessment & Plan Note (Signed)
Reviewed today with patient in office   Plan: Continue Singulair Start daily antihistamine Start nasal saline rinses We will start paperwork for [place]

## 2020-05-11 NOTE — Telephone Encounter (Signed)
-----   Message from Lauraine Rinne, NP sent at 05/11/2020 12:56 PM EDT ----- Dupixent start today Aaron Edelman

## 2020-05-11 NOTE — Telephone Encounter (Signed)
Routing...

## 2020-05-11 NOTE — Assessment & Plan Note (Signed)
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

## 2020-05-11 NOTE — Telephone Encounter (Signed)
Will update as we work through the benefits process.

## 2020-05-11 NOTE — Assessment & Plan Note (Signed)
Plan: We will start paperwork for Dupixent

## 2020-05-11 NOTE — Assessment & Plan Note (Signed)
History of elevated eosinophil counts History of elevated IgE Multiple environmental allergens Reviewed pulmonary function testing today with patient FeNO today 27, consider control given the fact the patient is not on maintenance inhalers today due to PFTs Previously managed on Fasenra for over a year and still required 5 prednisone burst, will consider this a failure of therapy Persistent rhinorrhea and congestion on physical exam today  Plan: Continue Breo Ellipta 200 Continue Spiriva Respimat 1.25 We will start paperwork today for Dupixent Continue rescue inhaler as needed for shortness of breath or wheezing Continue Singulair Start daily antihistamine in place of Xyzal such as cetirizine Start nasal saline rinses 4-week follow-up with our office

## 2020-05-11 NOTE — Assessment & Plan Note (Signed)
Reviewed elevations with patient in office  Plan: Continue Singulair Continue daily antihistamine Start nasal saline rinses We will start paperwork for Dupixent

## 2020-05-11 NOTE — Progress Notes (Signed)
@Patient  ID: Jeanene Erb, female    DOB: Dec 19, 1985, 34 y.o.   MRN: [id number]  Chief Complaint  Patient presents with  . Follow-up    pft     Referring provider: Marda Stalker, PA-C  HPI:  34 year old female never smoker followed in our office for asthma  PMH: Obesity Smoker/ Smoking History: Never smoker Maintenance: Breo Ellipta 200, Spiriva 1.25 Pt of: Dr. Chase Caller  05/11/2020  - Visit   34 year old female never smoker initially referred to our office in June/2021 for evaluation of dyspnea on exertion as well as asthma.  She is followed by Dr. Chase Caller.  She felt that back in June/2021 that over the previous 3 years her symptoms had worsened.  She had previous evaluation by an allergist in [place].  She required 5 prednisone burst in calendar year 2020.  And 3 prednisone burst so far in 2021.  ACT score in June/2021 was 7.  Plan of care from office visit back in June/2021 was to stop Qvar, start Breo Ellipta 200, prednisone taper was provided, lab work was recommended prior to starting prednisone, high-resolution CT chest was recommended, full pulmonary function testing was recommended as well as an FeNO.  Patient presenting back to our office today after completing pulmonary function testing.  Patient's FeNO today was 27.  She did not take Breo today given the fact that she was having a pulmonary function test.  Patient has completed high-resolution CT chest that did not show any interstitial lung disease.  Patient's lab work from last office visit did show significant elevations of the eosinophil counts as well as IgE.  Patient reporting that she is previously managed on [place] at her last allergist in [place], [place].  She reports that she started this in 2019 and was stopped in early 2021.  It was stopped due to lack of follow-up due to the COVID-19 pandemic.  She reports adherence and receiving her injections on time though during that period of 2019  through early 2021.  Still with her taking Berna Bue she still required 5 prednisone tapers in calendar year 2020.  She still required 3 in 2021.  She feels that she has significant rhinitis symptoms.  We will discuss this today.  She is currently taking Singulair as well as Xyzal.  She is not doing nasal saline rinses. On fasnera since 2019 - stopped early 2021   Questionaires / Pulmonary Flowsheets:   ACT:  Asthma Control Test ACT Total Score  05/11/2020 15  03/29/2020 7    MMRC: No flowsheet data found.  [facility]:  No flowsheet data found.  Tests:   03/29/2020-CBC with differential-eosinophils relative 12.7, eosinophils absolute 1.5  03/29/2020-respiratory allergy panel-showing elevations to cockroach, Box Elder, silver birch, oak, elm, pecan tree, [place] grass, Timothy grass, [place] grass, common ragweed, rough pigweed, IgE 133 >>>Highest elevations seen in [place], ragweed, Timothy grass  04/05/2020-CT chest high-res-no evidence of interstitial lung disease, air trapping is indicative of small airways disease, hepatic stenosis next 67/20/2021-pulmonary function test-FVC 2.94 (78% predicted), postbronchodilator ratio 84, postbronchodilator FEV1 2.46 (77% addicted), no bronchodilator response, DLCO 26.35 (117% predicted)  FENO:  Lab Results  Component Value Date   NITRICOXIDE 27 05/11/2020  >>> Did not take Breo prior to this  PFT: PFT Results Latest Ref Rng & Units 05/11/2020  FVC-Pre L 2.94  FVC-Predicted Pre % 78  FVC-Post L 2.91  FVC-Predicted Post % 77  Pre FEV1/FVC % % 83  Post FEV1/FCV % % 84  FEV1-Pre L 2.46  FEV1-Predicted Pre % 77  FEV1-Post L 2.46  DLCO UNC% % 117  DLCO COR %Predicted % 133  TLC L 4.61  TLC % Predicted % 91  RV % Predicted % 126    WALK:  No flowsheet data found.  Imaging: No results found.  Lab Results:  CBC    Component Value Date/Time   WBC 11.7 (H) 03/29/2020 1526   RBC 4.88 03/29/2020 1526   HGB 15.0 03/29/2020 1526   HCT  44.1 03/29/2020 1526   PLT 241.0 03/29/2020 1526   MCV 90.3 03/29/2020 1526   MCH 30.9 02/26/2018 0640   MCHC 34.0 03/29/2020 1526   RDW 12.7 03/29/2020 1526   LYMPHSABS 2.8 03/29/2020 1526   MONOABS 0.6 03/29/2020 1526   EOSABS 1.5 (H) 03/29/2020 1526   BASOSABS 0.1 03/29/2020 1526    BMET    Component Value Date/Time   NA 135 08/26/2018 1055   K 3.9 08/26/2018 1055   CL 99 08/26/2018 1055   CO2 25 08/26/2018 1055   GLUCOSE 323 (H) 08/26/2018 1055   BUN 6 08/26/2018 1055   CREATININE 0.87 08/26/2018 1055   CALCIUM 10.3 08/26/2018 1055   GFRNONAA >60 02/26/2018 0640   GFRAA >60 02/26/2018 0640    BNP No results found for: BNP  ProBNP No results found for: PROBNP  Specialty Problems      Pulmonary Problems   Asthma exacerbation   Severe persistent asthma   Shortness of breath      Allergies  Allergen Reactions  . Cefaclor Other (See Comments)    Happened as a child   . Tacrolimus Other (See Comments)    Happened as a child. unknown  . Latex     Immunization History  Administered Date(s) Administered  . PFIZER SARS-COV-2 Vaccination 01/21/2020, 02/04/2020    Past Medical History:  Diagnosis Date  . Allergies   . Anxiety and depression   . Asthma   . GERD (gastroesophageal reflux disease)   . Migraine   . UC (ulcerative colitis) ([place])     Tobacco History: Social History   Tobacco Use  Smoking Status Never Smoker  Smokeless Tobacco Never Used   Counseling given: Yes   Continue to not smoke  Outpatient Encounter Medications as of 05/11/2020  Medication Sig  . albuterol (PROVENTIL HFA;VENTOLIN HFA) 108 (90 Base) MCG/ACT inhaler Inhale 2 puffs into the lungs every 6 (six) hours as needed for wheezing or shortness of breath.  Marland Kitchen Benralizumab (FASENRA [place]) Inject 1 Syringe into the skin every 30 (thirty) days. Every 8 weeks after 3 months  . cetirizine (ZYRTEC) 10 MG tablet Take 10 mg by mouth at bedtime.  . eszopiclone (LUNESTA) 2 MG TABS tablet  Take 2 mg by mouth at bedtime.  Marland Kitchen etonogestrel (NEXPLANON) 68 MG IMPL implant 1 each by Subdermal route once.   . fluticasone furoate-vilanterol (BREO ELLIPTA) 200-25 MCG/INH AEPB Inhale 1 puff into the lungs daily.  Marland Kitchen levalbuterol (XOPENEX) 1.25 MG/0.5ML nebulizer solution Take 1.25 mg by nebulization every 4 (four) hours as needed for wheezing or shortness of breath.  . montelukast (SINGULAIR) 10 MG tablet Take 10 mg by mouth at bedtime.  Marland Kitchen SPIRIVA RESPIMAT 1.25 MCG/ACT AERS SMARTSIG:2 Inhalation Via Inhaler Daily  . [DISCONTINUED] beclomethasone (QVAR) 80 MCG/ACT inhaler Inhale 1 puff into the lungs at bedtime.  . [DISCONTINUED] mesalamine (CANASA) 1000 MG suppository Place 1 suppository (1,000 mg total) rectally at bedtime.  . [DISCONTINUED] mirtazapine (REMERON) 15 MG tablet Take 15 mg by mouth  at bedtime.   No facility-administered encounter medications on file as of 05/11/2020.     Review of Systems  Review of Systems  Constitutional: Negative for activity change, fatigue and fever.  HENT: Positive for congestion, postnasal drip and rhinorrhea. Negative for sinus pressure, sinus pain and sore throat.   Respiratory: Positive for shortness of breath. Negative for cough and wheezing.   Cardiovascular: Negative for chest pain and palpitations.  Gastrointestinal: Negative for diarrhea, nausea and vomiting.  Musculoskeletal: Negative for arthralgias.  Neurological: Negative for dizziness.  Psychiatric/Behavioral: Negative for sleep disturbance. The patient is not nervous/anxious.      Physical Exam  BP 140/90 (BP Location: Left Arm, Cuff Size: Normal)   Pulse 89   Temp 98.5 F (36.9 C) (Oral)   Ht 5' 4"  (1.626 m)   Wt 196 lb 3.2 oz (89 kg)   SpO2 100%   BMI 33.68 kg/m   Wt Readings from Last 5 Encounters:  05/11/20 196 lb 3.2 oz (89 kg)  03/29/20 195 lb 9.6 oz (88.7 kg)  09/09/18 195 lb (88.5 kg)  08/26/18 195 lb (88.5 kg)  02/25/18 205 lb 0.4 oz (93 kg)    BMI Readings  from Last 5 Encounters:  05/11/20 33.68 kg/m  03/29/20 33.57 kg/m  09/09/18 34.54 kg/m  08/26/18 34.54 kg/m  02/25/18 34.12 kg/m     Physical Exam Vitals and nursing note reviewed.  Constitutional:      General: She is not in acute distress.    Appearance: Normal appearance.  HENT:     Head: Normocephalic and atraumatic.     Right Ear: Tympanic membrane, ear canal and external ear normal. There is no impacted cerumen.     Left Ear: Tympanic membrane, ear canal and external ear normal. There is no impacted cerumen.     Nose: Congestion and rhinorrhea present.     Mouth/Throat:     Mouth: Mucous membranes are moist.     Pharynx: Oropharynx is clear.  Eyes:     Pupils: Pupils are equal, round, and reactive to light.  Cardiovascular:     Rate and Rhythm: Normal rate and regular rhythm.     Pulses: Normal pulses.     Heart sounds: Normal heart sounds. No murmur heard.   Pulmonary:     Effort: Pulmonary effort is normal. No respiratory distress.     Breath sounds: Normal breath sounds. No decreased air movement. No decreased breath sounds, wheezing or rales.  Musculoskeletal:     Cervical back: Normal range of motion.  Skin:    General: Skin is warm and dry.     Capillary Refill: Capillary refill takes less than 2 seconds.  Neurological:     General: No focal deficit present.     Mental Status: She is alert and oriented to person, place, and time. Mental status is at baseline.     Gait: Gait normal.  Psychiatric:        Mood and Affect: Mood normal.        Behavior: Behavior normal.        Thought Content: Thought content normal.        Judgment: Judgment normal.       Assessment & Plan:   Severe persistent asthma History of elevated eosinophil counts History of elevated IgE Multiple environmental allergens Reviewed pulmonary function testing today with patient FeNO today 27, consider control given the fact the patient is not on maintenance inhalers today due  to PFTs Previously managed on  Fasenra for over a year and still required 5 prednisone burst, will consider this a failure of therapy Persistent rhinorrhea and congestion on physical exam today  Plan: Continue Breo Ellipta 200 Continue Spiriva Respimat 1.25 We will start paperwork today for Dupixent Continue rescue inhaler as needed for shortness of breath or wheezing Continue Singulair Start daily antihistamine in place of Xyzal such as cetirizine Start nasal saline rinses 4-week follow-up with our office  Elevated IgE level Reviewed today with patient in office   Plan: Continue Singulair Start daily antihistamine Start nasal saline rinses We will start paperwork for Dupixent   Eosinophilia Plan: We will start paperwork for Dupixent  Multiple environmental allergies Reviewed elevations with patient in office  Plan: Continue Singulair Continue daily antihistamine Start nasal saline rinses We will start paperwork for Dupixent  Shortness of breath Suspect shortness of breath is directly 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 Purnell Shoemaker, Follow up with Wyn Quaker FNP-C.   Lauraine Rinne, NP 05/11/2020   This appointment required 42 minutes of patient care (this includes precharting, chart review, review of results, face-to-face care, etc.).

## 2020-05-11 NOTE — Patient Instructions (Addendum)
You were seen today by Lauraine Rinne, NP  for:   1. Severe persistent asthma without complication 2. Shortness of breath  Breo Ellipta 200 >>> Take 1 puff daily in the morning right when you wake up >>>Rinse your mouth out after use >>>This is a daily maintenance inhaler, NOT a rescue inhaler >>>Contact our office if you are having difficulties affording or obtaining this medication >>>It is important for you to be able to take this daily and not miss any doses   Spiriva Respimat 1.25 >>> 2 puffs daily >>> Do this every day >>>This is not a rescue inhaler   3. Multiple environmental allergies 4. Eosinophilic leukocytosis, unspecified type 5. Elevated IgE level  We will start paperwork today for Dupixent  This will very replace Fasenra  Continue taking Singulair  Please start taking a daily antihistamine:  >>>choose one of: zyrtec, claritin, allegra, or xyzal  >>>these are over the counter medications  >>>can choose generic option  >>>take daily  >>>this medication helps with allergies, post nasal drip, and cough   Start nasal saline rinses twice daily Use distilled water Shake well Get bottle lukewarm like a baby bottle    Follow Up:    Return in about 4 weeks (around 06/08/2020), or if symptoms worsen or fail to improve, for Follow up with Dr. Purnell Shoemaker, Follow up with Wyn Quaker FNP-C.   Please do your part to reduce the spread of COVID-19:      Reduce your risk of any infection  and COVID19 by using the similar precautions used for avoiding the common cold or flu:  Marland Kitchen Wash your hands often with soap and warm water for at least 20 seconds.  If soap and water are not readily available, use an alcohol-based hand sanitizer with at least 60% alcohol.  . If coughing or sneezing, cover your mouth and nose by coughing or sneezing into the elbow areas of your shirt or coat, into a tissue or into your sleeve (not your hands). Langley Gauss A MASK when in public  . Avoid  shaking hands with others and consider head nods or verbal greetings only. . Avoid touching your eyes, nose, or mouth with unwashed hands.  . Avoid close contact with people who are sick. . Avoid places or events with large numbers of people in one location, like concerts or sporting events. . If you have some symptoms but not all symptoms, continue to monitor at home and seek medical attention if your symptoms worsen. . If you are having a medical emergency, call 911.   [place] / e-Visit: [email or web address]         [facility]: [facility]: [phone]                   [facility]: [phone]     It is flu season:   >>> Best ways to protect herself from the flu: Receive the yearly flu vaccine, practice good hand hygiene washing with soap and also using hand sanitizer when available, eat a nutritious meals, get adequate rest, hydrate appropriately   Please contact the office if your symptoms worsen or you have concerns that you are not improving.   Thank you for choosing [facility] for your healthcare, and for allowing [place] to partner with you on your healthcare journey. I am thankful to be able to provide care to you today.   Aaron Edelman  [facility] FNP-C

## 2020-05-12 NOTE — Telephone Encounter (Signed)
Hello!  Is this patient a good candidate for self injections? Or will she need in office injections?  Thanks!

## 2020-05-12 NOTE — Telephone Encounter (Signed)
I believe per policy now this needs to be approved by the primary pulmonologist which would be Dr. Chase Caller.  I do believe the patient would be a good candidate for self injections after at least being observed in our office with the first dose as well as her being taught and then her administering her own dose with our supervision.  So at least 3 doses in the office.  Would be my preferred approach.Ultimately though this will be Dr. Golden Pop choice.Wyn Quaker, FNP

## 2020-05-14 NOTE — Telephone Encounter (Signed)
Support the plan laid out by Wyn Quaker. Yes 3 dises in our office with teaching and then home

## 2020-05-14 NOTE — Telephone Encounter (Signed)
Submitted a Prior Authorization request to [facility] for [place] via Cover My Meds. Will update once we receive a response.   (KeyElvin So) - [id number]

## 2020-05-17 NOTE — Telephone Encounter (Signed)
Received notification from [facility] regarding a prior authorization for [place]. Authorization has been APPROVED from 05/14/20 to 11/14/20.   Authorization # [id number]  Per plan, patient must fill through [place]

## 2020-05-18 NOTE — Telephone Encounter (Signed)
Routing back to the pool

## 2020-05-20 NOTE — Telephone Encounter (Signed)
Called patient to notify of approval.  Scheduled for new start appointment on 8/16 at 3:20PM as patient is going out of town next week until August 10th.   Mariella Saa, PharmD, [place], CPP Clinical Specialty Pharmacist (Rheumatology and Pulmonology)  05/20/2020 1:30 PM

## 2020-05-25 NOTE — Telephone Encounter (Signed)
Received fax that patient was approved for [place] copay card.  BIN# [id number]  PCN# [id number]  ID# [id number]  Grp# [id number]

## 2020-06-07 ENCOUNTER — Ambulatory Visit: Payer: Commercial Managed Care - PPO | Admitting: Pharmacist

## 2020-06-07 ENCOUNTER — Other Ambulatory Visit: Payer: Self-pay

## 2020-06-07 DIAGNOSIS — Z7189 Other specified counseling: Secondary | ICD-10-CM

## 2020-06-07 MED ORDER — DUPIXENT 300 MG/2ML ~~LOC~~ SOAJ: 600.0000 mg | Freq: Once | SUBCUTANEOUS | 0 refills | Status: DC

## 2020-06-07 MED ORDER — TRELEGY ELLIPTA 200-62.5-25 MCG/INH IN AEPB: 1.0000 | INHALATION_SPRAY | Freq: Every day | RESPIRATORY_TRACT | 5 refills | Status: DC

## 2020-06-07 NOTE — Progress Notes (Signed)
HPI Patient presents today to [facility] to see pharmacy team for [place] injection training and referred by Wyn Quaker, NP. Past medical history includes severe persistent asthma.   Patient reports symptoms have been stable since starting Breo-Ellipta  Respiratory Medications Current: Breo Ellipta 200-25 mcg, Spiriva Respimat 1.25 mcg, Xopenex 1.25 mg PRN, Singulair 10 mg, Proventil HFA 108 mcg Tried in past: Fasnera (2019 - early 2021) Patient reports no known adherence challenges  OBJECTIVE Allergies  Allergen Reactions  . Cefaclor Other (See Comments)    Happened as a child   . Tacrolimus Other (See Comments)    Happened as a child. unknown  . Latex     Outpatient Encounter Medications as of 06/07/2020  Medication Sig  . albuterol (PROVENTIL HFA;VENTOLIN HFA) 108 (90 Base) MCG/ACT inhaler Inhale 2 puffs into the lungs every 6 (six) hours as needed for wheezing or shortness of breath.  Marland Kitchen Benralizumab (FASENRA [place]) Inject 1 Syringe into the skin every 30 (thirty) days. Every 8 weeks after 3 months  . cetirizine (ZYRTEC) 10 MG tablet Take 10 mg by mouth at bedtime.  . eszopiclone (LUNESTA) 2 MG TABS tablet Take 2 mg by mouth at bedtime.  Marland Kitchen etonogestrel (NEXPLANON) 68 MG IMPL implant 1 each by Subdermal route once.   . fluticasone furoate-vilanterol (BREO ELLIPTA) 200-25 MCG/INH AEPB Inhale 1 puff into the lungs daily.  Marland Kitchen levalbuterol (XOPENEX) 1.25 MG/0.5ML nebulizer solution Take 1.25 mg by nebulization every 4 (four) hours as needed for wheezing or shortness of breath.  . montelukast (SINGULAIR) 10 MG tablet Take 10 mg by mouth at bedtime.  Marland Kitchen SPIRIVA RESPIMAT 1.25 MCG/ACT AERS SMARTSIG:2 Inhalation Via Inhaler Daily   No facility-administered encounter medications on file as of 06/07/2020.     Immunization History  Administered Date(s) Administered  . PFIZER SARS-COV-2 Vaccination 01/21/2020, 02/04/2020     PFTs PFT Results Latest Ref Rng & Units 05/11/2020    FVC-Pre L 2.94  FVC-Predicted Pre % 78  FVC-Post L 2.91  FVC-Predicted Post % 77  Pre FEV1/FVC % % 83  Post FEV1/FCV % % 84  FEV1-Pre L 2.46  FEV1-Predicted Pre % 77  FEV1-Post L 2.46  DLCO uncorrected ml/min/mmHg 26.35  DLCO UNC% % 117  DLCO corrected ml/min/mmHg 25.19  DLCO COR %Predicted % 112  DLVA Predicted % 133  TLC L 4.61  TLC % Predicted % 91  RV % Predicted % 126     Eosinophils Most recent blood eosinophil count was 12.7 cells/microL taken on 03/29/20.   IgE  Most recent blood IgE count was 133 kU/L taken on 03/29/20.   Assessment   1. Biologics training (Dupixent) Counseled patient on purpose, proper use, and adverse effects of Dupixent including injection site reactions and conjunctivitis. Discussed various management strategies for injection site reactions including the use of anti-histamines, cortisone cream, and cold compresses. Discussed Dupixent should be stored in the fridge and to remove medication from fridge at least 30 mins to 1 hour prior to injection to avoid stinging/burning.   Demonstrated proper injection technique with Dupixent demo pen. Patient able to demonstrate proper injection technique using the teach back method. Clinical Pharmacist injected loading dose of 600 mg in the upper right thigh with:  Sample Medication: Dupixent 300 mg/ml x 2 (loading dose) NDC: 2979-8921-19 Lot: [id number] Expiration:08-22-2021  Patient tolerated well.Observed for 30 mins in office for adverse reaction and none noted.    Dupixent approved by insurance.  Prescription will be sent to [facility]  which is required per insurance.  Reviewed specialty pharmacy refill process and how to set up first shipment. Patient verbalized understanding.    2. Medication Reconciliation  A drug regimen assessment was performed, including review of allergies, interactions, disease-state management, dosing and immunization history. Medications were reviewed with the  patient, including name, instructions, indication, goals of therapy, potential side effects, importance of adherence, and safe use.  Drug interaction(s): No major drug-interactions found.   3. Immunizations   Immunizations needed: Pneumovax   Immunizations received: COVID-19 vaccine (12/2019, 01/2020)  PLAN  Dupixent 600 mg (given as two 300 mg injections) - Loading dose given today in clinic  Then, start Dupixent 300 mg every 14 days   Patient must fill through [place] - prescription will be sent with Dupixent copay card after 2nd dose is given in 2 weeks  Replaced Breo Ellipta and Spiriva with Trelegy 200/62.5/25 mcg once daily (1 inhalation per day) - provided patient with copay card  Consider Pneumovax 23 vaccine at next follow-up visit.  Next appointment for 2nd dose scheduled for Monday, August 30th at 3:20PM  All questions encouraged and answered. Instructed patient to reach out with any further questions or concerns.  Thank you for allowing pharmacy to participate in this patient's care.  This appointment required 55 minutes of patient care (this includes precharting, chart review, review of results, face-to-face care, etc.).  Lorel Monaco, PharmD PGY2 Ambulatory Care Resident [place]

## 2020-06-07 NOTE — Patient Instructions (Addendum)
Nice meeting you today!  Below find a summary of what we discussed at your visit: . Start Dupixent 300 mg every 14 days . Start Trelegy 200/62.5/25 mcg once daily (1 inhalation per day) - Use copay card  . Stop Breo Ellipta . Stop Spiriva    Recommend Pneumovax 23 vaccine at next visit  Remember the 5 C's:  COUNTER- leave on the counter at least 30 mins but up to overnight to bring medication to room temperature and prevent stinging  COLD- Placing something cold (like and ice gel pack or cold water bottle) on the injection site just before cleansing with alcohol may help reduce pain  CLARITIN- for the first two weeks of treatment or  the day of, the day before, and the day after injecting to minimize injection site reactions  CORTISONE CREAM- apply if injection site is irritated and itching  CALL ME- if injection site reaction is bigger than the size of your fist, looks infected, blisters, or develop hives  Next appointment for 2nd dose will be Monday, August 30th at 3:20PM  Call [phone] with any questions or concerns.  Lorel Monaco, PharmD PGY2 Ambulatory Care Resident [place]

## 2020-06-08 ENCOUNTER — Telehealth: Payer: Self-pay | Admitting: Pharmacist

## 2020-06-08 ENCOUNTER — Ambulatory Visit: Payer: Commercial Managed Care - PPO | Admitting: Pulmonary Disease

## 2020-06-08 DIAGNOSIS — J4550 Severe persistent asthma, uncomplicated: Secondary | ICD-10-CM

## 2020-06-08 NOTE — Telephone Encounter (Signed)
Left voicemail to let patient know it is okay to reschedule appointment with Wyn Quaker, NP in 4 weeks.   Lorel Monaco, PharmD PGY2 Ambulatory Care Resident [place]

## 2020-06-21 ENCOUNTER — Other Ambulatory Visit: Payer: Self-pay

## 2020-06-21 ENCOUNTER — Ambulatory Visit: Payer: Commercial Managed Care - PPO | Admitting: Pharmacist

## 2020-06-21 DIAGNOSIS — Z7189 Other specified counseling: Secondary | ICD-10-CM

## 2020-06-21 DIAGNOSIS — J4550 Severe persistent asthma, uncomplicated: Secondary | ICD-10-CM

## 2020-06-21 MED ORDER — DUPIXENT 300 MG/2ML ~~LOC~~ SOAJ: 300.0000 mg | SUBCUTANEOUS | 1 refills | Status: DC

## 2020-06-21 NOTE — Patient Instructions (Signed)
Thank you for meeting with the pharmacy team today!  Below find a summary of what we discussed at your visit: . Start Dupixent 300 mg every 14 days - next dose is Monday July 05, 2020  Remember the 5 C's:  COUNTER- leave on the counter at least 30 mins but up to overnight to bring medication to room temperature and prevent stinging  COLD- Placing something cold (like and ice gel pack or cold water bottle) on the injection site just before cleansing with alcohol may help reduce pain  CLARITIN- for the first two weeks of treatment or  the day of, the day before, and the day after injecting to minimize injection site reactions  CORTISONE CREAM- apply if injection site is irritated and itching  CALL ME- if injection site reaction is bigger than the size of your fist, looks infected, blisters, or develop hives  Call [phone] with any questions or concerns.

## 2020-06-21 NOTE — Progress Notes (Signed)
HPI Patient presents today to [facility] to see pharmacy team for second [place] injection and referred by Wyn Quaker, NP. Past medical history includes severe persistent asthma.   At last visit on 06/07/2020, patient was counseled on purpose, proper administration technique, and adverse effects of Dupixent. Clinical Pharmacist administered initial loading dose of 600 mg in the upper right thigh with sample medication of Dupixent. Patient was observed for 30 mins and tolerated the medication well with no side effects. Additionally, Breo Ellipta and Spiriva was replaced with Trelegy 200/62.5/25 mcg daily.   Today, patient presents in good spirits. Reports medication adherence with Trelegy and no side effects. Denies conjunctivitis or severe injection site reaction since last dose of Dupixent. Reports eyes have been more watery than usual.    Respiratory Medications Current: Dupixent 300 mg every 14 days, Trelegy 200/62.5/25 mcg daily, Xopenex 1.25 mg PRN, Singulair 10 mg daily, Proventil HFA 108 mcg PRN Tried in past: Fasnera (2019 - early 2021) Patient reports no known adherence challenges  OBJECTIVE Allergies  Allergen Reactions  . Cefaclor Other (See Comments)    Happened as a child   . Tacrolimus Other (See Comments)    Happened as a child. unknown  . Latex     Outpatient Encounter Medications as of 06/21/2020  Medication Sig  . albuterol (PROVENTIL HFA;VENTOLIN HFA) 108 (90 Base) MCG/ACT inhaler Inhale 2 puffs into the lungs every 6 (six) hours as needed for wheezing or shortness of breath. (Patient not taking: Reported on 06/07/2020)  . cetirizine (ZYRTEC) 10 MG tablet Take 10 mg by mouth at bedtime.  . eszopiclone (LUNESTA) 2 MG TABS tablet Take 2 mg by mouth at bedtime.  Marland Kitchen etonogestrel (NEXPLANON) 68 MG IMPL implant 1 each by Subdermal route once.   . Fluticasone-Umeclidin-Vilant (TRELEGY ELLIPTA) 200-62.5-25 MCG/INH AEPB Inhale 1 puff into the lungs daily.  Marland Kitchen  levalbuterol (XOPENEX) 1.25 MG/0.5ML nebulizer solution Take 1.25 mg by nebulization every 4 (four) hours as needed for wheezing or shortness of breath. (Patient not taking: Reported on 06/07/2020)  . montelukast (SINGULAIR) 10 MG tablet Take 10 mg by mouth at bedtime.   No facility-administered encounter medications on file as of 06/21/2020.     Immunization History  Administered Date(s) Administered  . PFIZER SARS-COV-2 Vaccination 01/21/2020, 02/04/2020     PFTs PFT Results Latest Ref Rng & Units 05/11/2020  FVC-Pre L 2.94  FVC-Predicted Pre % 78  FVC-Post L 2.91  FVC-Predicted Post % 77  Pre FEV1/FVC % % 83  Post FEV1/FCV % % 84  FEV1-Pre L 2.46  FEV1-Predicted Pre % 77  FEV1-Post L 2.46  DLCO uncorrected ml/min/mmHg 26.35  DLCO UNC% % 117  DLCO corrected ml/min/mmHg 25.19  DLCO COR %Predicted % 112  DLVA Predicted % 133  TLC L 4.61  TLC % Predicted % 91  RV % Predicted % 126     Eosinophils Most recent blood eosinophil count was 12.7 cells/microL taken on 03/29/20.   IgE  Most recent blood IgE count was 133 kU/L taken on 03/29/20.   Assessment   1. Dupxient Re-counseled patient on purpose, proper use, and adverse effects of Dupixent including injection site reactions and conjunctivitis.  Demonstrated proper injection technique withDupixent pen. Patient able to demonstrate proper injection technique using the teach back method. Patient self-administered 300 mg in theupper left thighwith:  Sample Medication:Dupixent 300 mg/ml [id number] KNL:[id number] Expiration: 11-22-2021  [place] approved by insurance. Prescription will be sent to [facility] which is required per insurance.  2. Medication Reconciliation  A drug regimen assessment was performed, including review of allergies, interactions, disease-state management, dosing and immunization history. Medications were reviewed with the patient, including name, instructions, indication, goals of  therapy, potential side effects, importance of adherence, and safe use.  Drug interaction(s): No major drug-interactions found.   3. Immunizations   Immunizations needed: Pneumovax 23 and Prevnar 13  Immunizations received: COVID-19 vaccine (12/2019, 01/2020)  PLAN 1. Dupixent 300 mg [place] every 14 days 2. Patient must fill through [facility] - prescription sent with [place] card  3. Recommend follow-up with PCP for Pneumovax 23 and Prevnar 13 vaccinations  All questions encouraged and answered. Instructed patient to reach out with any further questions or concerns.  Thank you for allowing pharmacy to participate in this patient's care.  This appointment required 30 minutes of patient care (this includes precharting, chart review, review of results, face-to-face care, etc.).  Lorel Monaco, PharmD PGY2 Ambulatory Care Resident [place]

## 2020-07-19 ENCOUNTER — Encounter: Payer: Self-pay | Admitting: Pulmonary Disease

## 2020-07-19 ENCOUNTER — Ambulatory Visit: Payer: Commercial Managed Care - PPO | Admitting: Pulmonary Disease

## 2020-07-19 ENCOUNTER — Other Ambulatory Visit: Payer: Self-pay

## 2020-07-19 VITALS — BP 130/80 | HR 107 | Temp 97.9°F | Ht 64.0 in | Wt 193.8 lb

## 2020-07-19 DIAGNOSIS — R768 Other specified abnormal immunological findings in serum: Secondary | ICD-10-CM | POA: Diagnosis not present

## 2020-07-19 DIAGNOSIS — J4550 Severe persistent asthma, uncomplicated: Secondary | ICD-10-CM

## 2020-07-19 DIAGNOSIS — Z9109 Other allergy status, other than to drugs and biological substances: Secondary | ICD-10-CM

## 2020-07-19 DIAGNOSIS — D7219 Other eosinophilia: Secondary | ICD-10-CM | POA: Diagnosis not present

## 2020-07-19 DIAGNOSIS — Z0000 Encounter for general adult medical examination without abnormal findings: Secondary | ICD-10-CM | POA: Insufficient documentation

## 2020-07-19 NOTE — Assessment & Plan Note (Signed)
Plan: Would recommend seasonal flu vaccine in 1 week since she just recently took her Dupixent We will recommend the COVID-19 booster when available for her patient population, the CDC is not given formal recommendations on this yet, likely this will be around December/2021 when you would receive this

## 2020-07-19 NOTE — Patient Instructions (Addendum)
You were seen today by Lauraine Rinne, NP  for:   1. Severe persistent asthma without complication 2. Elevated IgE level 3. Eosinophilic leukocytosis, unspecified type 4. Multiple environmental allergies  Trelegy Ellipta 200 >>> 1 puff daily in the morning >>>rinse mouth out after use  >>> This inhaler contains 3 medications that help manage her respiratory status, contact our office if you cannot afford this medication or unable to remain on this medication  Continue Dupixent  Continue Singulair  Continue Xyzal   5. Healthcare maintenance  Obtain flu vaccine in 1 week from local pharmacy or our office  We will recommend the COVID-19 booster for you when it is available for your patient population, this likely we recommend that you receive this in December/2021    Follow Up:    Return in about 4 months (around 11/18/2020), or if symptoms worsen or fail to improve, for Follow up with Dr. Purnell Shoemaker.   Notification of test results are managed in the following manner: If there are  any recommendations or changes to the  plan of care discussed in office today,  we will contact you and let you know what they are. If you do not hear from [place], then your results are normal and you can view them through your  MyChart account , or a letter will be sent to you. Thank you again for trusting [place] with your care  - Thank you, [facility]    It is flu season:   >>> Best ways to protect herself from the flu: Receive the yearly flu vaccine, practice good hand hygiene washing with soap and also using hand sanitizer when available, eat a nutritious meals, get adequate rest, hydrate appropriately       Please contact the office if your symptoms worsen or you have concerns that you are not improving.   Thank you for choosing [facility] for your healthcare, and for allowing [place] to partner with you on your healthcare journey. I am thankful to be able to provide care to you today.    Wyn Quaker FNP-C

## 2020-07-19 NOTE — Progress Notes (Signed)
@Patient  ID: Melissa Moon, female    DOB: [birth year], 34 y.o.   MRN: [id number]  Chief Complaint  Patient presents with  . Follow-up    no concerns    Referring provider: Marda Stalker, PA-C  HPI:  34 year old female never smoker followed in our office for asthma  PMH: Obesity Smoker/ Smoking History: Never smoker Maintenance: Breo Ellipta 200, Spiriva 1.25 Pt of: Dr. Chase Caller  07/19/2020  - Visit   34 year old female never smoker followed in our office for asthma.  Patient of Dr. Chase Caller.  Patient completing follow-up with our office today.  Last seen in July/2021.  Plan of care at that office visit was for the patient to remain adherent to [place]rio Ellipta 200 as well as Spiriva Respimat 1.25.  Paperwork was started for [place].  Patient was encouraged to continue Singulair..  Patient was started on Dupixent per documentation on 06/21/2020.  Patient reports that she is already transition to home Dupixent dosing.  She is doing well.  She has not had to use her rescue inhaler at all.  ACT score has improved from 15 to 25.  She remains adherent to Trelegy Ellipta 200.  She would like to receive the flu vaccine.  She is wondering if she also needs the COVID-19 booster.  We will discuss this today.  Questionaires / Pulmonary Flowsheets:   ACT:  Asthma Control Test ACT Total Score  07/19/2020 25  05/11/2020 15  03/29/2020 7    MMRC: No flowsheet data found.  [facility]:  No flowsheet data found.  Tests:   03/29/2020-CBC with differential-eosinophils relative 12.7, eosinophils absolute 1.5  03/29/2020-respiratory allergy panel-showing elevations to cockroach, Box Elder, silver birch, oak, elm, pecan tree, [place] grass, Timothy grass, [place] grass, common ragweed, rough pigweed, IgE 133 >>>Highest elevations seen in [place], ragweed, Timothy grass  04/05/2020-CT chest high-res-no evidence of interstitial lung disease, air trapping is indicative of small airways disease,  hepatic stenosis next 67/20/2021-pulmonary function test-FVC 2.94 (78% predicted), postbronchodilator ratio 84, postbronchodilator FEV1 2.46 (77% addicted), no bronchodilator response, DLCO 26.35 (117% predicted)  FENO:  Lab Results  Component Value Date   NITRICOXIDE 27 05/11/2020    PFT: PFT Results Latest Ref Rng & Units 05/11/2020  FVC-Pre L 2.94  FVC-Predicted Pre % 78  FVC-Post L 2.91  FVC-Predicted Post % 77  Pre FEV1/FVC % % 83  Post FEV1/FCV % % 84  FEV1-Pre L 2.46  FEV1-Predicted Pre % 77  FEV1-Post L 2.46  DLCO uncorrected ml/min/mmHg 26.35  DLCO UNC% % 117  DLCO corrected ml/min/mmHg 25.19  DLCO COR %Predicted % 112  DLVA Predicted % 133  TLC L 4.61  TLC % Predicted % 91  RV % Predicted % 126    WALK:  No flowsheet data found.  Imaging: No results found.  Lab Results:  CBC    Component Value Date/Time   WBC 11.7 (H) 03/29/2020 1526   RBC 4.88 03/29/2020 1526   HGB 15.0 03/29/2020 1526   HCT 44.1 03/29/2020 1526   PLT 241.0 03/29/2020 1526   MCV 90.3 03/29/2020 1526   MCH 30.9 02/26/2018 0640   MCHC 34.0 03/29/2020 1526   RDW 12.7 03/29/2020 1526   LYMPHSABS 2.8 03/29/2020 1526   MONOABS 0.6 03/29/2020 1526   EOSABS 1.5 (H) 03/29/2020 1526   BASOSABS 0.1 03/29/2020 1526    BMET    Component Value Date/Time   NA 135 08/26/2018 1055   K 3.9 08/26/2018 1055   CL 99 08/26/2018 1055  CO2 25 08/26/2018 1055   GLUCOSE 323 (H) 08/26/2018 1055   BUN 6 08/26/2018 1055   CREATININE 0.87 08/26/2018 1055   CALCIUM 10.3 08/26/2018 1055   GFRNONAA >60 02/26/2018 0640   GFRAA >60 02/26/2018 0640    BNP No results found for: BNP  ProBNP No results found for: PROBNP  Specialty Problems      Pulmonary Problems   Asthma exacerbation   Severe persistent asthma   Shortness of breath      Allergies  Allergen Reactions  . Cefaclor Other (See Comments)    Happened as a child   . Tacrolimus Other (See Comments)    Happened as a child.  unknown  . Latex     Immunization History  Administered Date(s) Administered  . PFIZER SARS-COV-2 Vaccination 01/21/2020, 02/04/2020    Past Medical History:  Diagnosis Date  . Allergies   . Anxiety and depression   . Asthma   . GERD (gastroesophageal reflux disease)   . Migraine   . UC (ulcerative colitis) ([place])     Tobacco History: Social History   Tobacco Use  Smoking Status Never Smoker  Smokeless Tobacco Never Used   Counseling given: Not Answered   Continue to not smoke  Outpatient Encounter Medications as of 07/19/2020  Medication Sig  . cetirizine (ZYRTEC) 10 MG tablet Take 10 mg by mouth at bedtime.  . Dupilumab (DUPIXENT) 300 MG/2ML SOPN Inject 300 mg into the skin every 14 (fourteen) days.  . eszopiclone (LUNESTA) 2 MG TABS tablet Take 2 mg by mouth at bedtime.  Marland Kitchen etonogestrel (NEXPLANON) 68 MG IMPL implant 1 each by Subdermal route once.   . Fluticasone-Umeclidin-Vilant (TRELEGY ELLIPTA) 200-62.5-25 MCG/INH AEPB Inhale 1 puff into the lungs daily.  . montelukast (SINGULAIR) 10 MG tablet Take 10 mg by mouth at bedtime.  Marland Kitchen albuterol (PROVENTIL HFA;VENTOLIN HFA) 108 (90 Base) MCG/ACT inhaler Inhale 2 puffs into the lungs every 6 (six) hours as needed for wheezing or shortness of breath. (Patient not taking: Reported on 06/07/2020)  . levalbuterol (XOPENEX) 1.25 MG/0.5ML nebulizer solution Take 1.25 mg by nebulization every 4 (four) hours as needed for wheezing or shortness of breath. (Patient not taking: Reported on 06/07/2020)   No facility-administered encounter medications on file as of 07/19/2020.     Review of Systems  Review of Systems  Constitutional: Negative for activity change, fatigue and fever.  HENT: Negative for sinus pressure, sinus pain and sore throat.   Respiratory: Negative for cough, shortness of breath and wheezing.   Cardiovascular: Negative for chest pain and palpitations.  Gastrointestinal: Negative for diarrhea, nausea and vomiting.    Musculoskeletal: Negative for arthralgias.  Neurological: Negative for dizziness.  Psychiatric/Behavioral: Negative for sleep disturbance. The patient is not nervous/anxious.      Physical Exam  BP 130/80 (BP Location: Left Arm, Patient Position: Sitting, Cuff Size: Normal)   Pulse (!) 107   Temp 97.9 F (36.6 C) (Temporal)   Ht 5' 4"  (1.626 m)   Wt 193 lb 12.8 oz (87.9 kg)   SpO2 100%   BMI 33.27 kg/m   Wt Readings from Last 5 Encounters:  07/19/20 193 lb 12.8 oz (87.9 kg)  05/11/20 196 lb 3.2 oz (89 kg)  03/29/20 195 lb 9.6 oz (88.7 kg)  09/09/18 195 lb (88.5 kg)  08/26/18 195 lb (88.5 kg)    BMI Readings from Last 5 Encounters:  07/19/20 33.27 kg/m  05/11/20 33.68 kg/m  03/29/20 33.57 kg/m  09/09/18 34.54 kg/m  08/26/18 34.54 kg/m     Physical Exam Vitals and nursing note reviewed.  Constitutional:      General: She is not in acute distress.    Appearance: Normal appearance. She is obese.  HENT:     Head: Normocephalic and atraumatic.     Right Ear: Tympanic membrane, ear canal and external ear normal. There is no impacted cerumen.     Left Ear: Tympanic membrane, ear canal and external ear normal. There is no impacted cerumen.     Nose: Rhinorrhea present. No congestion.     Mouth/Throat:     Mouth: Mucous membranes are moist.     Pharynx: Oropharynx is clear.     Comments: +pnd Eyes:     Pupils: Pupils are equal, round, and reactive to light.  Cardiovascular:     Rate and Rhythm: Normal rate and regular rhythm.     Pulses: Normal pulses.     Heart sounds: Normal heart sounds. No murmur heard.   Pulmonary:     Effort: Pulmonary effort is normal. No respiratory distress.     Breath sounds: Normal breath sounds. No decreased air movement. No decreased breath sounds, wheezing or rales.  Musculoskeletal:     Cervical back: Normal range of motion.  Skin:    General: Skin is warm and dry.     Capillary Refill: Capillary refill takes less than 2  seconds.  Neurological:     General: No focal deficit present.     Mental Status: She is alert and oriented to person, place, and time. Mental status is at baseline.     Gait: Gait normal.  Psychiatric:        Mood and Affect: Mood normal.        Behavior: Behavior normal.        Thought Content: Thought content normal.        Judgment: Judgment normal.       Assessment & Plan:   Severe persistent asthma Plan: Continue Trelegy Ellipta 200 Continue Dupixent Continue rescue inhaler as needed for shortness of breath or wheezing Continue Singulair Continue Xyzal 2-monthfollow-up with our office  Multiple environmental allergies Plan: Continue DMaringouinmaintenance Plan: Would recommend seasonal flu vaccine in 1 week since she just recently took her Dupixent We will recommend the COVID-19 booster when available for her patient population, the CDC is not given formal recommendations on this yet, likely this will be around December/2021 when you would receive this  Eosinophilia Plan: Continue Dupixent  Elevated IgE level Plan: Continue Dupixent    Return in about 4 months (around 11/18/2020), or if symptoms worsen or fail to improve, for Follow up with Dr. RPurnell Shoemaker   BLauraine Rinne NP 07/19/2020   This appointment required 23 minutes of patient care (this includes precharting, chart review, review of results, face-to-face care, etc.).

## 2020-07-19 NOTE — Assessment & Plan Note (Signed)
Plan: Continue Dupixent

## 2020-07-19 NOTE — Assessment & Plan Note (Signed)
Plan: Continue Dupixent Continue Singulair Continue Xyzal

## 2020-07-19 NOTE — Assessment & Plan Note (Signed)
Plan: Continue Trelegy Ellipta 200 Continue Dupixent Continue rescue inhaler as needed for shortness of breath or wheezing Continue Singulair Continue Xyzal 84-monthfollow-up with our office

## 2020-12-17 IMAGING — CT CT CHEST HIGH RESOLUTION W/O CM
2 of 8 series · 14 of 36 positions shown, 17 images · non-contrast
Comparison: None.

CLINICAL DATA: Shortness of breath, cough, asthma.

EXAM:
CT CHEST WITHOUT CONTRAST
TECHNIQUE: Multidetector CT imaging of the chest was performed following the
standard protocol without intravenous contrast. High resolution
imaging of the lungs, as well as inspiratory and expiratory imaging,
was performed.

[Series 5: high resolution · axial · 0.68mm/px · z∈[-332,-102]mm · 11 of 279 slices shown, 14 images]
[im 24/279  mediastinal]
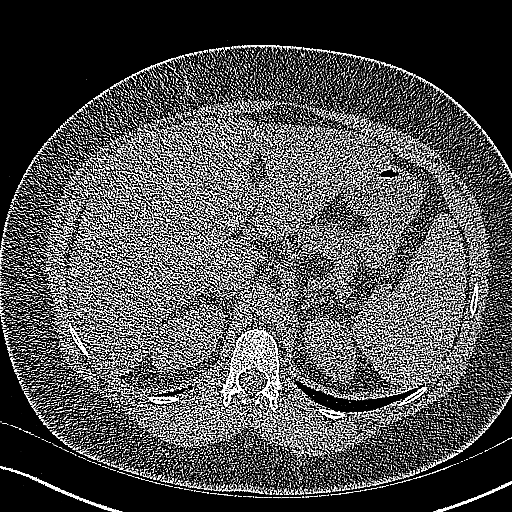
[im 24/279  lung]
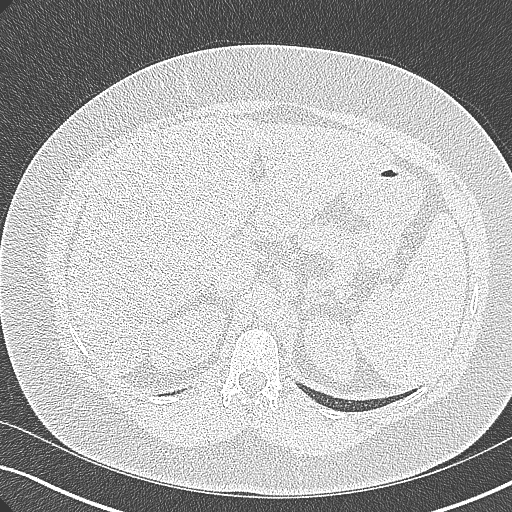
[im 47/279  lung]
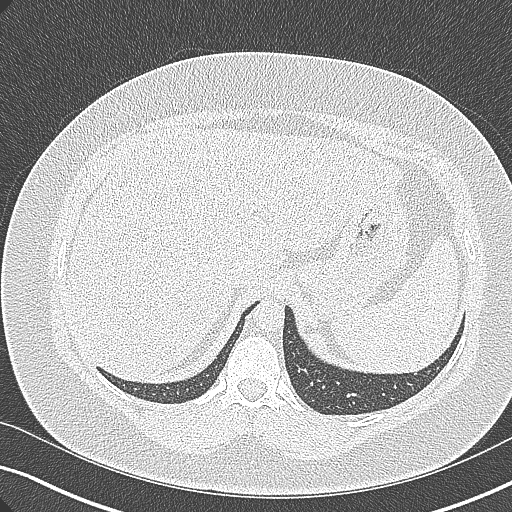
[im 70/279  lung]
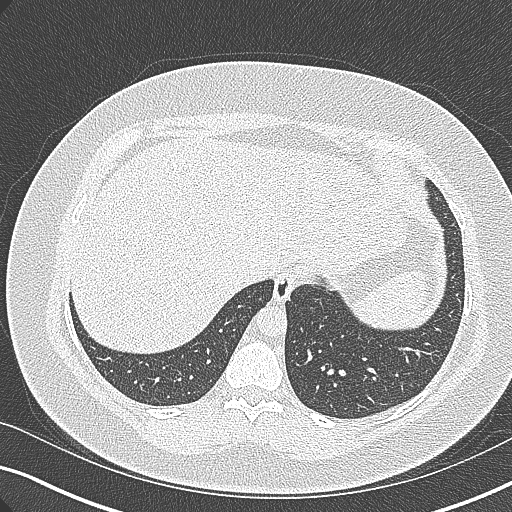
[im 93/279  lung]
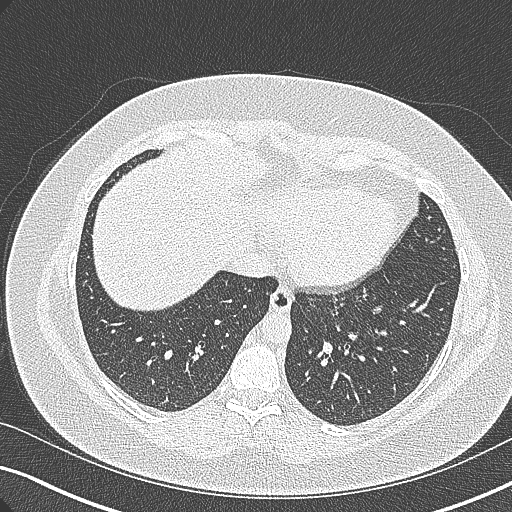
[im 116/279  mediastinal]
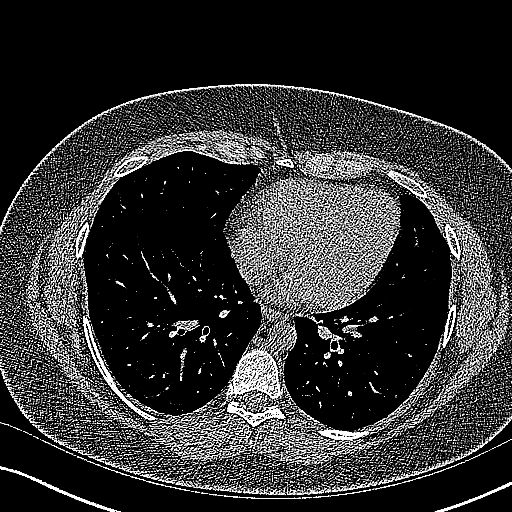
[im 116/279  lung]
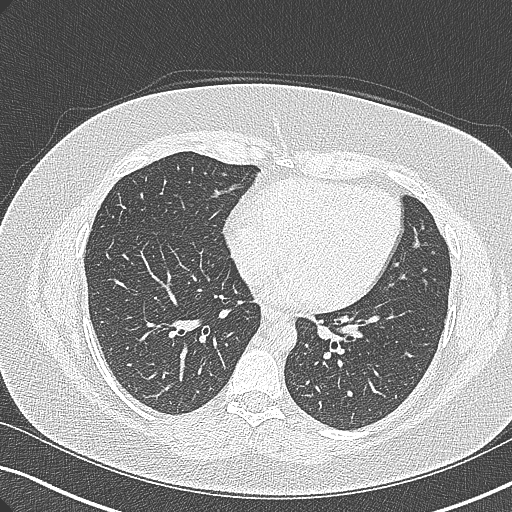
[im 140/279  lung]
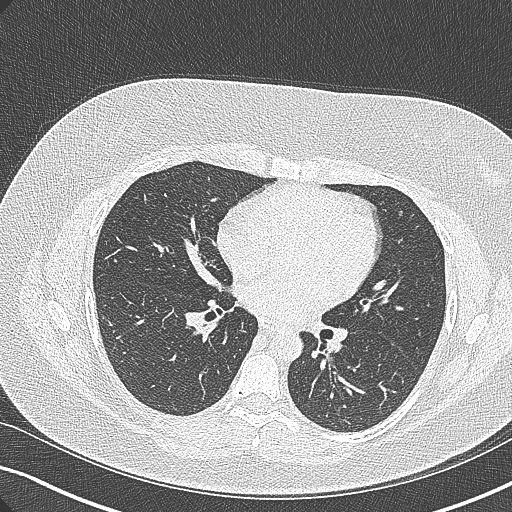
[im 163/279  lung]
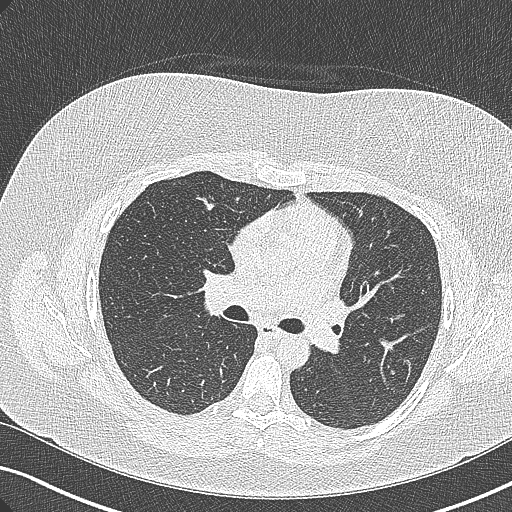
[im 186/279  lung]
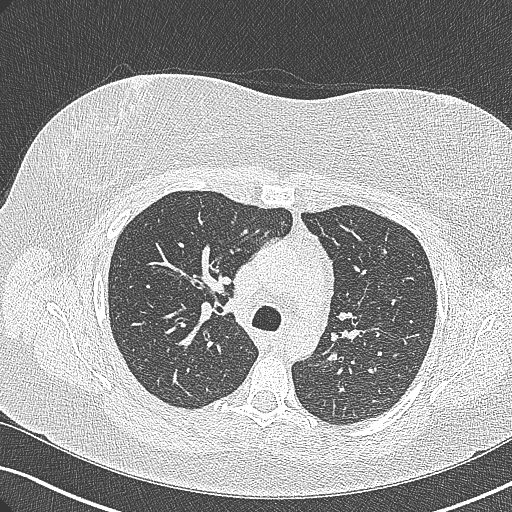
[im 209/279  mediastinal]
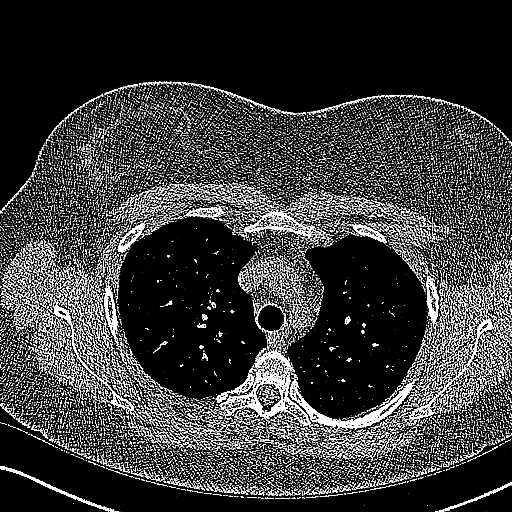
[im 209/279  lung]
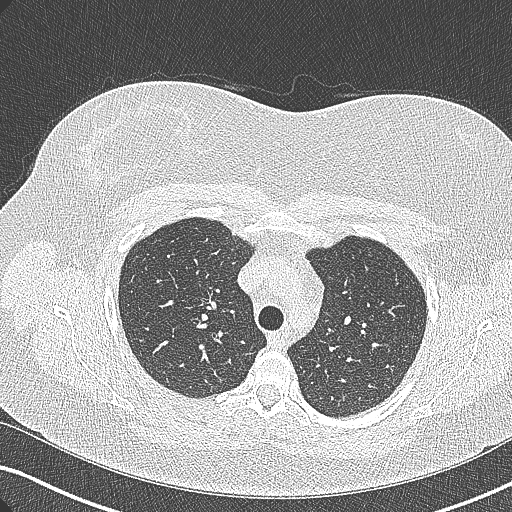
[im 232/279  lung]
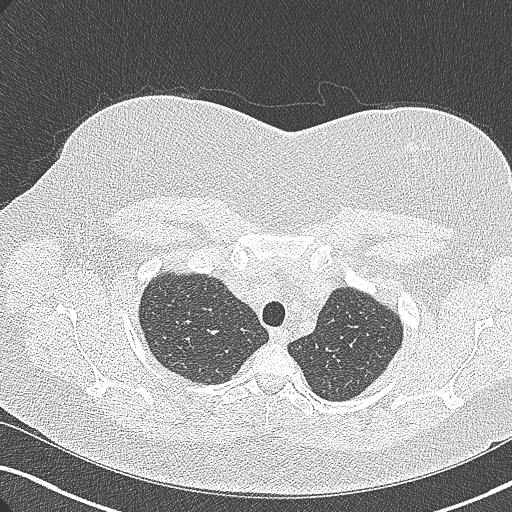
[im 255/279  lung]
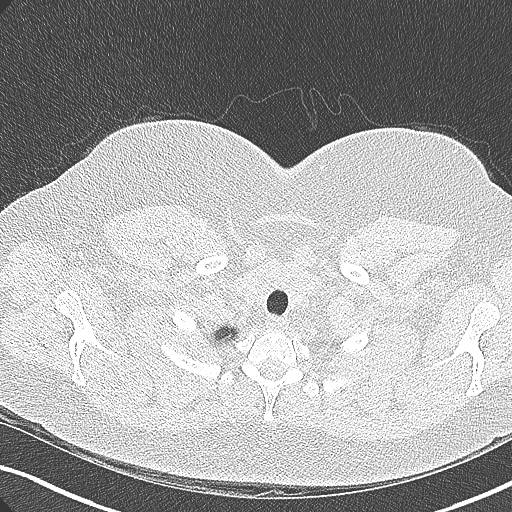

[Series 7: coronal · coronal · 0.59mm/px · 3 of 114 slices shown]
[im 23/114  lung]
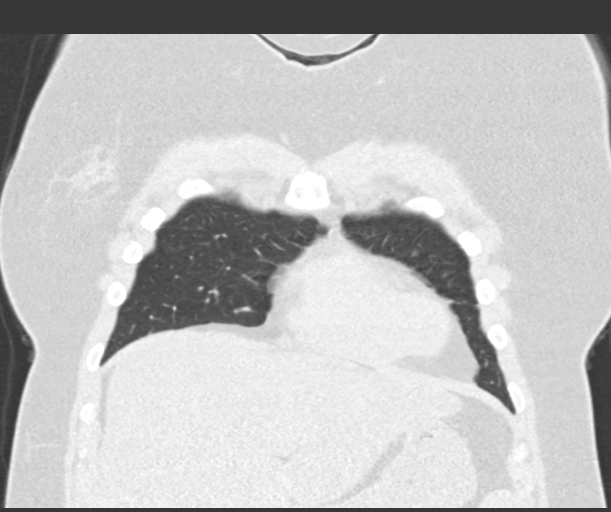
[im 46/114  lung]
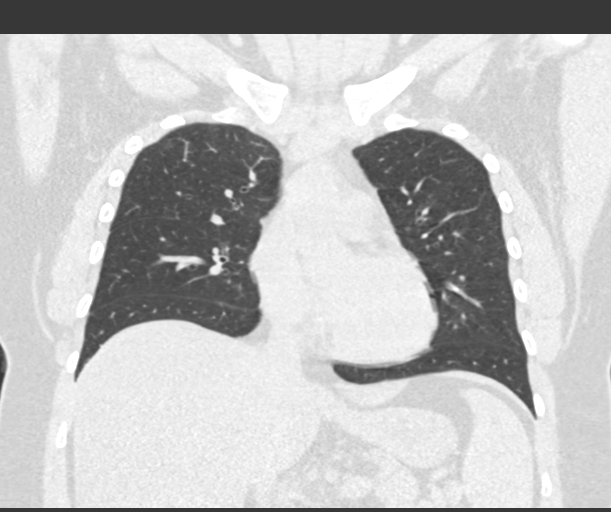
[im 68/114  lung]
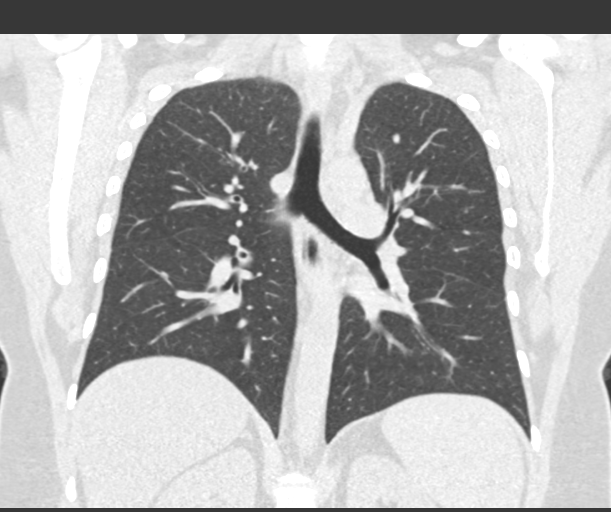

[14 of 36 positions shown; findings below may reference images not displayed]

FINDINGS: Cardiovascular: Heart size normal.  No pericardial effusion.

Mediastinum/Nodes: No pathologically enlarged mediastinal or
axillary lymph nodes. Hilar regions are difficult to definitively
evaluate without IV contrast. Esophagus is unremarkable.

Lungs/Pleura: Negative for subpleural reticulation, traction
bronchiectasis/bronchiolectasis, ground-glass, architectural
distortion or honeycombing. No pleural fluid. Airway is
unremarkable. Mild air trapping.

Upper Abdomen: Liver is decreased in attenuation diffusely.
Visualized portions of the adrenal glands, kidneys, spleen, pancreas
and stomach are grossly unremarkable.

Musculoskeletal: No worrisome lytic or sclerotic lesions.
IMPRESSION: 1. No evidence of interstitial lung disease.
2. Air trapping is indicative of small airways disease.
3. Hepatic steatosis.

## 2020-12-28 NOTE — Telephone Encounter (Signed)
Patient sent email   I have new insurance, are you able to send over a new Dupixent prescription and/or pre-auth so that I can get it refilled? Thanks!   I will send to pharmacy team to follow up

## 2020-12-29 NOTE — Telephone Encounter (Signed)
Submitted a Prior Authorization request to [facility] for [place] via Cover My Meds. Will update once we receive a response.  Key: [id number]

## 2020-12-31 ENCOUNTER — Other Ambulatory Visit: Payer: Self-pay | Admitting: Pharmacist

## 2020-12-31 DIAGNOSIS — J4550 Severe persistent asthma, uncomplicated: Secondary | ICD-10-CM

## 2020-12-31 MED ORDER — DUPIXENT 300 MG/2ML ~~LOC~~ SOAJ: 300.0000 mg | SUBCUTANEOUS | 0 refills | Status: DC

## 2021-01-03 NOTE — Telephone Encounter (Signed)
Attempted to call pt to get f/u scheduled with MR but unable to reach. Left pt a detailed message to call office so we could get this appt scheduled.   When pt calls back, please schedule f/u with Dr. Chase Caller. Thanks!

## 2021-01-05 ENCOUNTER — Other Ambulatory Visit: Payer: Self-pay | Admitting: Pharmacist

## 2021-01-05 DIAGNOSIS — J4550 Severe persistent asthma, uncomplicated: Secondary | ICD-10-CM

## 2021-01-05 MED ORDER — DUPIXENT 300 MG/2ML ~~LOC~~ SOAJ: 300.0000 mg | SUBCUTANEOUS | 2 refills | Status: DC

## 2021-01-05 NOTE — Telephone Encounter (Signed)
Refill for Dupixent sent to new insurance-preferred pharmacy: [facility]. Routing to triage to have patient scheduled for f/u visit with provider.  Knox Saliva, PharmD, MPH Clinical Pharmacist (Rheumatology and Pulmonology)

## 2021-01-07 MED ORDER — DUPIXENT 300 MG/2ML ~~LOC~~ SOAJ: 300.0000 mg | SUBCUTANEOUS | 2 refills | Status: DC

## 2021-01-07 NOTE — Telephone Encounter (Signed)
Dupixent rx re-sent to [facility] with notation that patient is established on therapy and does not require loading dose.

## 2021-01-07 NOTE — Addendum Note (Signed)
Addended by: Cassandria Anger on: 01/07/2021 01:22 PM   Modules accepted: Orders

## 2021-01-07 NOTE — Telephone Encounter (Signed)
Received fax from [facility] requesting prescription clarification that patient doesn't need loading dose and dx code, so they can transfer rx to [facility].  Can you escribe rx directly to [facility] to prevent further delay?

## 2021-01-19 ENCOUNTER — Ambulatory Visit: Payer: Commercial Managed Care - PPO | Admitting: Gastroenterology

## 2021-01-24 ENCOUNTER — Other Ambulatory Visit: Payer: Self-pay

## 2021-01-24 ENCOUNTER — Encounter: Payer: Self-pay | Admitting: Adult Health

## 2021-01-24 ENCOUNTER — Ambulatory Visit (INDEPENDENT_AMBULATORY_CARE_PROVIDER_SITE_OTHER): Payer: 59 | Admitting: Adult Health

## 2021-01-24 DIAGNOSIS — J301 Allergic rhinitis due to pollen: Secondary | ICD-10-CM

## 2021-01-24 DIAGNOSIS — J309 Allergic rhinitis, unspecified: Secondary | ICD-10-CM | POA: Insufficient documentation

## 2021-01-24 DIAGNOSIS — J4550 Severe persistent asthma, uncomplicated: Secondary | ICD-10-CM | POA: Diagnosis not present

## 2021-01-24 MED ORDER — EPINEPHRINE 0.3 MG/0.3ML IJ SOAJ: 0.3000 mg | INTRAMUSCULAR | 1 refills | Status: AC | PRN

## 2021-01-24 NOTE — Assessment & Plan Note (Signed)
Appears to be stable with improved symptom control on current regimen.  Since beginning [place] has had more controlled symptoms. We will continue on trigger prevention.  Mild allergic rhinitis flare.  Most likely due to seasonal changes. She is continue on her current triple therapy maintenance inhaler and Dupixent. EpiPen prescription sent to the pharmacy.  Instructions given.  Patient is continue to do home Dupixent injections. If doing well on return.  Could consider changing triple therapy to ICS/LABA inhaler only  Plan  Patient Instructions  Continue on Trelegy inhaler 1 puff daily Continue on Dupixent injections Change Zyrtec to Allegra 186m daily  Add Chlorpheniramine (Chlor tab)  471mAt bedtime  As needed  Singular 10 mg daily Albuterol inhaler as needed Asthma action plan discussed Follow-up in 6 months with Dr. RaChase Callerr Melissa Ben NP  Please contact office for sooner follow up if symptoms do not improve or worsen or seek emergency care

## 2021-01-24 NOTE — Patient Instructions (Addendum)
Continue on Trelegy inhaler 1 puff daily Continue on Dupixent injections Change Zyrtec to Allegra 166m daily  Add Chlorpheniramine (Chlor tab)  430mAt bedtime  As needed  Singular 10 mg daily Albuterol inhaler as needed Asthma action plan discussed Follow-up in 6 months with Dr. RaChase Callerr Luxe Cuadros NP  Please contact office for sooner follow up if symptoms do not improve or worsen or seek emergency care

## 2021-01-24 NOTE — Progress Notes (Signed)
@Patient  ID: Melissa Moon, female    DOB: 1986/01/17, 35 y.o.   MRN: [id number]  Chief Complaint  Patient presents with  . Follow-up    Referring provider: Lenetta Quaker  HPI: 35 year old female never smoker followed for severe persistent asthma with allergic phenotype  TEST/EVENTS :  03/29/2020-CBC with differential-eosinophils relative 12.7, eosinophils absolute 1.5  03/29/2020-respiratory allergy panel-showing elevations to cockroach, Box Elder, silver birch, oak, elm, pecan tree, [place] grass, Timothy grass, Johnson grass, common ragweed, rough pigweed, IgE 133 >>>Highest elevations seen in [place], ragweed, Timothy grass  04/05/2020-CT chest high-res-no evidence of interstitial lung disease, air trapping is indicative of small airways disease, hepatic stenosis next 67/20/2021-pulmonary function test-FVC 2.94 (78% predicted), postbronchodilator ratio 84, postbronchodilator FEV1 2.46 (77% predicted), no bronchodilator response, DLCO 26.35 (117% predicted)  01/24/2021 Follow up : Asthma  Patient returns for a 59-monthfollow-up.  Patient has underlying severe persistent asthma.  She is on Trelegy inhaler daily.  She was started on Dupixent June 21, 2020.  She is doing home injections.  Denies any issues with self injections.  Denies rash or allergic reaction symptoms.  Says she is having no difficulties. .  She denies any flare of cough or wheezing.  ACT score is 18.  She does feel that since starting Dupixent that her breathing has been better.   has noticed over the last couple weeks with increased pollen outside that she has had more drippy nose sinus drainage and a little congestion.  She does not like nasal sprays so does not want to try these.  She is on Singulair and Zyrtec.  She has had minimum albuterol use.  Says that her activity level has been good with no decrease in activity tolerance.  No recent steroid use.  No emergency room or hospitalizations for her  breathing. Denies pregnancy , not planning pregnancy , previous tubal ligation.   Allergies  Allergen Reactions  . Cefaclor Other (See Comments)    Happened as a child   . Tacrolimus Other (See Comments)    Happened as a child. unknown  . Latex     Immunization History  Administered Date(s) Administered  . PFIZER(Purple Top)SARS-COV-2 Vaccination 01/21/2020, 02/04/2020    Past Medical History:  Diagnosis Date  . Allergies   . Anxiety and depression   . Asthma   . GERD (gastroesophageal reflux disease)   . Migraine   . [place]ative [place]     Tobacco History: Social History   Tobacco Use  Smoking Status Never Smoker  Smokeless Tobacco Never Used   Counseling given: Not Answered   Outpatient Medications Prior to Visit  Medication Sig Dispense Refill  . albuterol (PROVENTIL HFA;VENTOLIN HFA) 108 (90 Base) MCG/ACT inhaler Inhale 2 puffs into the lungs every 6 (six) hours as needed for wheezing or shortness of breath.    .Marland Kitchenatomoxetine (STRATTERA) 40 MG capsule Take 40 mg by mouth daily.    . cetirizine (ZYRTEC) 10 MG tablet Take 10 mg by mouth at bedtime.    . Dupilumab (DUPIXENT) 300 MG/2ML SOPN Inject 300 mg into the skin every 14 (fourteen) days. Patient already established on therapy (no loading needed) 4 mL 2  . etonogestrel (NEXPLANON) 68 MG IMPL implant 1 each by Subdermal route once.     . Fluticasone-Umeclidin-Vilant (TRELEGY ELLIPTA) 200-62.5-25 MCG/INH AEPB Inhale 1 puff into the lungs daily. 1 each 5  . montelukast (SINGULAIR) 10 MG tablet Take 10 mg by mouth at bedtime.    .Marland Kitchen  eszopiclone (LUNESTA) 2 MG TABS tablet Take 2 mg by mouth at bedtime. (Patient not taking: Reported on 01/24/2021)    . levalbuterol (XOPENEX) 1.25 MG/0.5ML nebulizer solution Take 1.25 mg by nebulization every 4 (four) hours as needed for wheezing or shortness of breath. (Patient not taking: No sig reported)     No facility-administered medications prior to visit.     Review  of Systems:   Constitutional:   No  weight loss, night sweats,  Fevers, chills, fatigue, or  lassitude.  HEENT:   No headaches,  Difficulty swallowing,  Tooth/dental problems, or  Sore throat,                No sneezing, itching, ear ache,  +nasal congestion, post nasal drip,   CV:  No chest pain,  Orthopnea, PND, swelling in lower extremities, anasarca, dizziness, palpitations, syncope.   GI  No heartburn, indigestion, abdominal pain, nausea, vomiting, diarrhea, change in bowel habits, loss of appetite, bloody stools.   Resp: No shortness of breath with exertion or at rest.  No excess mucus, no productive cough,  No non-productive cough,  No coughing up of blood.  No change in color of mucus.  No wheezing.  No chest wall deformity  Skin: no rash or lesions.  GU: no dysuria, change in color of urine, no urgency or frequency.  No flank pain, no hematuria   MS:  No joint pain or swelling.  No decreased range of motion.  No back pain.    Physical Exam  BP 110/68 (BP Location: Left Arm, Cuff Size: Normal)   Pulse 92   Temp 97.9 F (36.6 C) (Temporal)   Ht 5' 4"  (1.626 m)   Wt 184 lb 3.2 oz (83.6 kg)   SpO2 99% Comment: RA  BMI 31.62 kg/m   GEN: A/Ox3; pleasant , NAD, well nourished    HEENT:  [place]/AT,    NOSE-clear drainage THROAT-clear, no lesions, no postnasal drip or exudate noted.   NECK:  Supple w/ fair ROM; no JVD; normal carotid impulses w/o bruits; no thyromegaly or nodules palpated; no lymphadenopathy.    RESP  Clear  P & A; w/o, wheezes/ rales/ or rhonchi. no accessory muscle use, no dullness to percussion  CARD:  RRR, no m/r/g, no peripheral edema, pulses intact, no cyanosis or clubbing.  GI:   Soft & nt; nml bowel sounds; no organomegaly or masses detected.   Musco: Warm bil, no deformities or joint swelling noted.   Neuro: alert, no focal deficits noted.    Skin: Warm, no lesions or rashes    Lab Results:    BNP No results found for: BNP  ProBNP No  results found for: PROBNP  Imaging: No results found.    PFT Results Latest Ref Rng & Units 05/11/2020  FVC-Pre L 2.94  FVC-Predicted Pre % 78  FVC-Post L 2.91  FVC-Predicted Post % 77  Pre FEV1/FVC % % 83  Post FEV1/FCV % % 84  FEV1-Pre L 2.46  FEV1-Predicted Pre % 77  FEV1-Post L 2.46  DLCO uncorrected ml/min/mmHg 26.35  DLCO UNC% % 117  DLCO corrected ml/min/mmHg 25.19  DLCO COR %Predicted % 112  DLVA Predicted % 133  TLC L 4.61  TLC % Predicted % 91  RV % Predicted % 126    Lab Results  Component Value Date   NITRICOXIDE 27 05/11/2020        Assessment & Plan:   Severe persistent asthma Appears to be stable with improved  symptom control on current regimen.  Since beginning [place] has had more controlled symptoms. We will continue on trigger prevention.  Mild allergic rhinitis flare.  Most likely due to seasonal changes. She is continue on her current triple therapy maintenance inhaler and Dupixent. EpiPen prescription sent to the pharmacy.  Instructions given.  Patient is continue to do home Dupixent injections. If doing well on return.  Could consider changing triple therapy to ICS/LABA inhaler only  Plan  Patient Instructions  Continue on Trelegy inhaler 1 puff daily Continue on Dupixent injections Change Zyrtec to Allegra 12m daily  Add Chlorpheniramine (Chlor tab)  481mAt bedtime  As needed  Singular 10 mg daily Albuterol inhaler as needed Asthma action plan discussed Follow-up in 6 months with Dr. RaChase Callerr Channah Godeaux NP  Please contact office for sooner follow up if symptoms do not improve or worsen or seek emergency care       Allergic rhinitis Mild flare with seasonal changes. Change Zyrtec to Allegra.  Add in Chlortab 4 mg at bedtime as needed  Plan    Patient Instructions  Continue on Trelegy inhaler 1 puff daily Continue on Dupixent injections Change Zyrtec to Allegra 18034maily  Add Chlorpheniramine (Chlor tab)  4mg3m bedtime   As needed  Singular 10 mg daily Albuterol inhaler as needed Asthma action plan discussed Follow-up in 6 months with Dr. RamaChase CallerParrett NP  Please contact office for sooner follow up if symptoms do not improve or worsen or seek emergency care           TammRexene Edison 01/24/2021

## 2021-01-24 NOTE — Assessment & Plan Note (Signed)
Mild flare with seasonal changes. Change Zyrtec to Allegra.  Add in Chlortab 4 mg at bedtime as needed  Plan    Patient Instructions  Continue on Trelegy inhaler 1 puff daily Continue on Dupixent injections Change Zyrtec to Allegra 125m daily  Add Chlorpheniramine (Chlor tab)  472mAt bedtime  As needed  Singular 10 mg daily Albuterol inhaler as needed Asthma action plan discussed Follow-up in 6 months with Dr. RaChase Callerr [place] NP  Please contact office for sooner follow up if symptoms do not improve or worsen or seek emergency care

## 2021-02-18 ENCOUNTER — Other Ambulatory Visit: Payer: Self-pay | Admitting: Adult Health

## 2021-02-18 MED ORDER — TRELEGY ELLIPTA 100-62.5-25 MCG/INH IN AEPB
1.0000 | INHALATION_SPRAY | Freq: Every day | RESPIRATORY_TRACT | 0 refills | Status: DC
Start: 2021-02-18 — End: 2021-05-08

## 2021-02-18 NOTE — Telephone Encounter (Signed)
I hate to hear that does she have her formulary so we can see what is covered and glad to pick something off her formulary so is more affordable also can use co-pay cards as she is commercial pay Can we leave her a sample or 2 until we can figure this out at the front desk

## 2021-02-18 NOTE — Telephone Encounter (Signed)
[name],  I have let patient know we will need the formulary and to call when they get it. Also left 2 samples and a co-pay card for patient.  Just FYI- thanks!

## 2021-02-18 NOTE — Telephone Encounter (Signed)
Hi Mrs. Melissa Moon,  I am almost out of my Trelegy but with my new insurance it is going to be over $500 to refill. Is there something else that can be prescribed in place of it?   [name] Parrett, please advise. Thanks!

## 2021-02-24 NOTE — Telephone Encounter (Signed)
Patient has attached a copy of her formulary. Inhalers start on page 163.

## 2021-02-24 NOTE — Telephone Encounter (Signed)
Called patient and looked at her formulary.  All of her inhalers are tier 3 and above including Trelegy.  Have advised her to call her insurance to see what options we have.  Only inhaler that is on her formulary is Advair which is a tier 2 so we could consider changing to that or Wiexla .    She will call me back as soon as she gets the insurance information

## 2021-03-25 ENCOUNTER — Other Ambulatory Visit: Payer: Self-pay | Admitting: Pulmonary Disease

## 2021-03-25 DIAGNOSIS — J4550 Severe persistent asthma, uncomplicated: Secondary | ICD-10-CM

## 2021-04-06 NOTE — Telephone Encounter (Signed)
Mychart message sent by pt: [name], [name]  Lauraine Rinne, NP 10 minutes ago (10:08 AM)  Hello,   Can I get a refill for the Dupixent pens? My insurance is saying they can't process because there are no more refills left on the prescription, and they have been calling and faxing since 6/3 to get a new one. I am supposed to take my next injection on Monday 6/20. Thanks !

## 2021-05-08 ENCOUNTER — Other Ambulatory Visit: Payer: Self-pay

## 2021-05-10 MED ORDER — TRELEGY ELLIPTA 100-62.5-25 MCG/INH IN AEPB: 1.0000 | INHALATION_SPRAY | Freq: Every day | RESPIRATORY_TRACT | 3 refills | Status: AC

## 2021-05-16 ENCOUNTER — Telehealth: Payer: Self-pay | Admitting: *Deleted

## 2021-05-16 ENCOUNTER — Telehealth: Payer: Self-pay | Admitting: Adult Health

## 2021-05-16 ENCOUNTER — Telehealth (INDEPENDENT_AMBULATORY_CARE_PROVIDER_SITE_OTHER): Payer: 59 | Admitting: Acute Care

## 2021-05-16 ENCOUNTER — Encounter: Payer: Self-pay | Admitting: Acute Care

## 2021-05-16 DIAGNOSIS — U071 COVID-19: Secondary | ICD-10-CM

## 2021-05-16 DIAGNOSIS — J4550 Severe persistent asthma, uncomplicated: Secondary | ICD-10-CM

## 2021-05-16 MED ORDER — NIRMATRELVIR/RITONAVIR (PAXLOVID)TABLET: 3.0000 | ORAL_TABLET | Freq: Two times a day (BID) | ORAL | 0 refills | Status: DC

## 2021-05-16 NOTE — Patient Instructions (Addendum)
It is good to talk to you today.  We will send in a prescription for Paxlovid.  Take as directed This is an anti viral medication that can decrease the length of the virus, and its severity.  Remember it will make your birth control less effective, so use a Condom if you are sexually active.  Call the office if you feel worse not better, or seek emergency care. Follow up Video visit in 1 week to ensure you are feeling better.  No Dupixent injections until you are better, we will evaluate in 1 week as to whether you need to delay your next injection by a week.  Remember to isolate away from others.  Continue Trelegy, Singulair and Zyrtec as you have been doing Rinse mouth after use.  Please contact office for sooner follow up if symptoms do not improve or worsen or seek emergency care

## 2021-05-16 NOTE — Telephone Encounter (Signed)
Called and spoke with patient, she is able to do a video visit, advised she will be sent a link for the video visit.  She verbalized understanding.  Nothing further needed.

## 2021-05-16 NOTE — Telephone Encounter (Signed)
Set up for video visit today to further evaluate and see if additional tx  Melissa Moon has a 2:30 visit or 4pm opening

## 2021-05-16 NOTE — Telephone Encounter (Signed)
Called and spoke with patient, advised of recommendations per Rexene Edison NP.  She verbalized understanding.  Scheduled for 4:30 pm.  Nothing further needed.

## 2021-05-16 NOTE — Telephone Encounter (Signed)
Spoke to patient.  Patient tested positive for covid today with a home test. She reports of nasal congestion, sore throat and stomach discomfort x2d Denies f/s/w, wheezing, sob, n/v/d or additional sx.  She is not using any OTC meds to help with sx.  Using trelegy once daily. She has not had to use albuterol.  Fully vaccinated against covid and flu. She is requesting recommendations. She is concerned with her hx of asthma.   TP, please advise. Thanks

## 2021-05-16 NOTE — Progress Notes (Signed)
Virtual Visit via Video Note  I connected with Melissa Moon on 05/16/21 at  4:30 PM EDT by a video enabled telemedicine application and verified that I am speaking with the correct person using two identifiers.  Location: Patient: At Home Provider: [place], [place], [place], [address]    I discussed the limitations of evaluation and management by telemedicine and the availability of in person appointments. The patient expressed understanding and agreed to proceed.  Synopsis 35 year old female never smoker followed for severe persistent asthma with allergic phenotype. She uses Trelegy as her maintenance, She is on Dupixent injections, and she is compliant with her Zyrtec and Singulair. She is followed by Dr. Chase Caller.  History of Present Illness: Pt. With PMH of Asthma. She called the office today to let us know she was Covid +. She states she has has symptoms x 2 days of runny nose , sore throat and some GI upset. She denies any fever, wheezing  or shortness of breath. We discussed that with her asthma, she is at higher risks of complications with Covid. We discussed the option of taking an antiviral. She stated that she would prefer to be safe rather than sorry, and wants to take the anti-viral. We discussed that Paxlovid can decrease the effectiveness of her birth control. I have advised her to use a condom if she is sexually active while on Paxlovid, and for several days after. She has confirmed for me that she does not have any renal / kidney problems. Last chemistry was done in 2019, and GFR was 80.93 at that time. We will do a follow up video visit in 1 week to ensure she is getting better. We discussed calling the office if she gets worse not better, or seeking emergency care. She understands she needs to isolate herself, wear a mask, and let others she has been around know she is Covid +. She verbalized understanding.    Of note, her 24 year old daughter is also Covid +. She  has called her pediatrician.    Observations/Objective: 03/29/2020-CBC with differential-eosinophils relative 12.7, eosinophils absolute 1.5   03/29/2020-respiratory allergy panel-showing elevations to cockroach, Box Elder, silver birch, oak, elm, pecan tree, [place] grass, Timothy grass, [place] grass, common ragweed, rough pigweed, IgE 133 >>>Highest elevations seen in [place], ragweed, Timothy grass   04/05/2020-CT chest high-res-no evidence of interstitial lung disease, air trapping is indicative of small airways disease, hepatic stenosis next 67/20/2021-pulmonary function test-FVC 2.94 (78% predicted), postbronchodilator ratio 84, postbronchodilator FEV1 2.46 (77% predicted), no bronchodilator response, DLCO 26.35 (117% predicted)  Video Physical Exam  Stuffy nose, No obvious lymphadenopathy, no cough or wheeze. Able to speak in full sentences without dyspnea.  No accessory muscle use No facial flushing No rash Alert and oriented x 3, appropriate  Assessment and Plan: + Covid 19 Diagnosed 7/25 Symptoms x 2 days prior to test confirmation Plan We will send in a prescription for Paxlovid.  Take as directed This is an anti viral medication that can decrease the length of the virus, and its severity.  Remember it will make your birth control less effective, so use a Condom if you are sexually active.  Call the office if you feel worse not better, or seek emergency care. Follow up Video visit in 1 week to ensure you are feeling better.  No Dupixent injections until you are better, we will evaluate in 1 week as to whether you need to delay your next injection by a week.  Remember  to isolate away from others.  Continue Trelegy, Singulair and Zyrtec as you have been doing Rinse mouth after use.  Please contact office for sooner follow up if symptoms do not improve or worsen or seek emergency care   Follow Up Instructions: Follow up  video visit in 1 week. Call the office if you feel  worse not better, or seek emergency care. Please contact office for sooner follow up if symptoms do not improve or worsen or seek emergency care  I discussed the assessment and treatment plan with the patient. The patient was provided an opportunity to ask questions and all were answered. The patient agreed with the plan and demonstrated an understanding of the instructions.   The patient was advised to call back or seek an in-person evaluation if the symptoms worsen or if the condition fails to improve as anticipated.  I spent 40 minutes dedicated to the care of this patient on the date of this encounter to include pre-visit review of records, face-to-face time with the patient discussing conditions above, post visit ordering of testing, clinical documentation with the electronic health record, making appropriate referrals as documented, and communicating necessary information to the patient's healthcare team.    Magdalen Spatz, NP  05/16/2021

## 2021-05-18 MED ORDER — NIRMATRELVIR/RITONAVIR (PAXLOVID)TABLET: 3.0000 | ORAL_TABLET | Freq: Two times a day (BID) | ORAL | 0 refills | Status: AC

## 2021-05-18 NOTE — Telephone Encounter (Signed)
Checked the Rx and saw that SG had put the status of the Rx as phone in instead of normal. Called pt's pharmacy and spoke with Crystal to see if the Rx had been sent and she stated that the Rx was not received.   I have fixed the status of the Rx so it would be electronically sent to the pharmacy for pt. Called and spoke with pt letting her know this had been taken care of and she verbalized understanding. Nothing further needed.

## 2021-05-18 NOTE — Telephone Encounter (Signed)
Pt called office as well about the Rx and Rx has been fixed and was sent to pharmacy. Pt has been made aware. Nothing further needed.

## 2021-05-18 NOTE — Telephone Encounter (Signed)
Pt stated that she has recently tested positive for Covid-19 on Monday and she did a virtual visit with Nurse Elie Confer and stated that she still has not received her rx for Paxlovid and is requesting that it be sent.  Pharmacy; [id number] [place]; [phone]

## 2021-05-30 ENCOUNTER — Telehealth: Payer: Self-pay | Admitting: Acute Care

## 2021-05-30 ENCOUNTER — Encounter: Payer: 59 | Admitting: Acute Care

## 2021-05-30 NOTE — Telephone Encounter (Signed)
Pt. Left her video visit 05/30/2021 prior to the time the provider was able to log on. I logged on at 4:10 pm. Previous patient was a tele visit in a facility and we had to wait for their guardian to arrive, therefore the visit went over 10 minutes. Nazirah  stated that she had been on the call for 15 minutes, but had logged on early and didn't think she should have to wait. I explained very pleasantly that we have other patient's, and just as we sometimes run over in the office environment, we can also run over with earlier patient's in the virtual environment as we have both virtual and in patient visits, and patient needs vary. She told me she did not appreciate my attitude, and hung up.   Pt. Was last seen in the office 01/2021. She called the office 7/25 with positive Covid results. 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 this video visit was to ensure she was better after treatment. . She was able to speak in full, clear sentences. She did not appear to be in any respiratory distress.There was no audible wheezing. I feel certain, if she was still sick she would have waited for assistance. She can follow up as she feels is indicated, as she did not stay on the call long enough to establish follow up.

## 2021-05-30 NOTE — Telephone Encounter (Signed)
Call made to patient, confirmed  DOB. Patient states she is okay and most of her symptoms have resolved. States she is using her trelegy daily and albuterol as needed but rarely needed it. She is wanting to know whether or not she can resume her dupixent.   SG please advise. Thanks :)

## 2021-05-30 NOTE — Telephone Encounter (Signed)
ATC Patient. LM to call back after 8:30am 05/31/21.

## 2021-06-03 ENCOUNTER — Telehealth: Payer: Self-pay | Admitting: Internal Medicine

## 2021-06-03 NOTE — Telephone Encounter (Signed)
disregard

## 2021-07-28 ENCOUNTER — Ambulatory Visit (INDEPENDENT_AMBULATORY_CARE_PROVIDER_SITE_OTHER): Payer: 59 | Admitting: Adult Health

## 2021-07-28 ENCOUNTER — Other Ambulatory Visit: Payer: Self-pay

## 2021-07-28 ENCOUNTER — Encounter: Payer: Self-pay | Admitting: Adult Health

## 2021-07-28 DIAGNOSIS — J301 Allergic rhinitis due to pollen: Secondary | ICD-10-CM

## 2021-07-28 DIAGNOSIS — J4550 Severe persistent asthma, uncomplicated: Secondary | ICD-10-CM

## 2021-07-28 NOTE — Assessment & Plan Note (Signed)
Improved control on current regimen   Plan  Patient Instructions  Continue on Trelegy inhaler 1 puff daily Continue on Dupixent injections Continue on Zyrtec 39m At bedtime   Chlorpheniramine (Chlor tab)  445mAt bedtime  As needed  Singular 10 mg daily Albuterol inhaler as needed Asthma action plan discussed Follow-up in 6 months with Dr. RaChase Callerr Naomee [place] NP  Please contact office for sooner follow up if symptoms do not improve or worsen or seek emergency care

## 2021-07-28 NOTE — Progress Notes (Signed)
@Patient  ID: Melissa Moon, female    DOB: 1986-07-23, 35 y.o.   MRN: [id number]  Chief Complaint  Patient presents with   Follow-up    Referring provider: Lenetta Quaker  HPI: 35 year old female never smoker followed for severe persistent asthma with allergic phenotype  TEST/EVENTS :  03/29/2020-CBC with differential-eosinophils relative 12.7, eosinophils absolute 1.5   03/29/2020-respiratory allergy panel-showing elevations to cockroach, Box Elder, silver birch, oak, elm, pecan tree, [place] grass, Timothy grass, [place] grass, common ragweed, rough pigweed, IgE 133 >>>Highest elevations seen in [place], ragweed, Timothy grass   04/05/2020-CT chest high-res-no evidence of interstitial lung disease, air trapping is indicative of small airways disease, hepatic stenosis  67/20/2021-pulmonary function test-FVC 2.94 (78% predicted), postbronchodilator ratio 84, postbronchodilator FEV1 2.46 (77% predicted), no bronchodilator response, DLCO 26.35 (117% predicted)  07/28/2021 Follow up : Asthma w/ allergic phenotype  Patient returns for 20-monthfollow-up.  Patient has underlying severe persistent asthma with allergic phenotype.  She remains on Trelegy daily.  She was started on Dupixent in August 2021.  She does home injections.  She says she does not have any issues with self injections denies any rash or known allergic reaction symptoms.  She says that her asthma has been under better control. She has chronic allergies is on Singulair and Zyrtec.  She denies any flare of cough or wheezing.  No recent steroid use or emergency room visits. Since starting Dupixent has made a huge difference in her asthma control with decreased symptoms .  Had COVID infection in July , cold symptoms only . Did not require steroids.  No steroids over last 1 year.   Patient denies any pregnancy.  Is not planning an upcoming pregnancy and has had a previous tubal ligation.    Allergies  Allergen  Reactions   Cefaclor Other (See Comments)    Happened as a child  Unsure of reaction    Tacrolimus Other (See Comments)    Happened as a child. unknown Other reaction(s): Other (See Comments) Increase blood glucose    Latex     Immunization History  Administered Date(s) Administered   Influenza-Unspecified 07/25/2021   PFIZER(Purple Top)SARS-COV-2 Vaccination 01/21/2020, 02/04/2020    Past Medical History:  Diagnosis Date   Allergies    Anxiety and depression    Asthma    GERD (gastroesophageal reflux disease)    Migraine    [place]cerative [place]) [place]     Tobacco History: Social History   Tobacco Use  Smoking Status Never  Smokeless Tobacco Never   Counseling given: Not Answered   Outpatient Medications Prior to Visit  Medication Sig Dispense Refill   albuterol (PROVENTIL HFA;VENTOLIN HFA) 108 (90 Base) MCG/ACT inhaler Inhale 2 puffs into the lungs every 6 (six) hours as needed for wheezing or shortness of breath.     cetirizine (ZYRTEC) 10 MG tablet Take 10 mg by mouth at bedtime.     Dupilumab (DUPIXENT) 300 MG/2ML SOPN INJECT 300MG SUBCUTANEOUSLY EVERY 14 DAYS 12 mL 0   EPINEPHrine 0.3 mg/0.3 mL IJ SOAJ injection Inject 0.3 mg into the muscle as needed for anaphylaxis. 1 each 1   etonogestrel (NEXPLANON) 68 MG IMPL implant 1 each by Subdermal route once.      Fluticasone-Umeclidin-Vilant (TRELEGY ELLIPTA) 100-62.5-25 MCG/INH AEPB Inhale 1 puff into the lungs daily. 60 each 3   levalbuterol (XOPENEX) 1.25 MG/0.5ML nebulizer solution Take 1.25 mg by nebulization every 4 (four) hours as needed for wheezing or shortness of breath.  montelukast (SINGULAIR) 10 MG tablet Take 10 mg by mouth at bedtime.     Fluticasone-Umeclidin-Vilant (TRELEGY ELLIPTA) 200-62.5-25 MCG/INH AEPB Inhale 1 puff into the lungs daily. 1 each 5   No facility-administered medications prior to visit.     Review of Systems:   Constitutional:   No  weight loss, night sweats,  Fevers,  chills, fatigue, or  lassitude.  HEENT:   No headaches,  Difficulty swallowing,  Tooth/dental problems, or  Sore throat,                No sneezing, itching, ear ache,  +nasal congestion, post nasal drip,   CV:  No chest pain,  Orthopnea, PND, swelling in lower extremities, anasarca, dizziness, palpitations, syncope.   GI  No heartburn, indigestion, abdominal pain, nausea, vomiting, diarrhea, change in bowel habits, loss of appetite, bloody stools.   Resp: No shortness of breath with exertion or at rest.  No excess mucus, no productive cough,  No non-productive cough,  No coughing up of blood.  No change in color of mucus.  No wheezing.  No chest wall deformity  Skin: no rash or lesions.  GU: no dysuria, change in color of urine, no urgency or frequency.  No flank pain, no hematuria   MS:  No joint pain or swelling.  No decreased range of motion.  No back pain.    Physical Exam  BP 118/78 (BP Location: Left Arm, Patient Position: Sitting, Cuff Size: Normal)   Pulse 75   Temp 98.2 F (36.8 C) (Oral)   Ht 5' 4"  (1.626 m)   Wt 188 lb 12.8 oz (85.6 kg)   SpO2 100%   BMI 32.41 kg/m   GEN: A/Ox3; pleasant , NAD, well nourished    HEENT:  [place]/AT,  EACs-clear, TMs-wnl, NOSE-clear, THROAT-clear, no lesions, no postnasal drip or exudate noted.   NECK:  Supple w/ fair ROM; no JVD; normal carotid impulses w/o bruits; no thyromegaly or nodules palpated; no lymphadenopathy.    RESP  Clear  P & A; w/o, wheezes/ rales/ or rhonchi. no accessory muscle use, no dullness to percussion  CARD:  RRR, no m/r/g, no peripheral edema, pulses intact, no cyanosis or clubbing.  GI:   Soft & nt; nml bowel sounds; no organomegaly or masses detected.   Musco: Warm bil, no deformities or joint swelling noted.   Neuro: alert, no focal deficits noted.    Skin: Warm, no lesions or rashes    Lab Results:   BNP No results found for: BNP  ProBNP No results found for: PROBNP  Imaging: No results  found.    PFT Results Latest Ref Rng & Units 05/11/2020  FVC-Pre L 2.94  FVC-Predicted Pre % 78  FVC-Post L 2.91  FVC-Predicted Post % 77  Pre FEV1/FVC % % 83  Post FEV1/FCV % % 84  FEV1-Pre L 2.46  FEV1-Predicted Pre % 77  FEV1-Post L 2.46  DLCO uncorrected ml/min/mmHg 26.35  DLCO UNC% % 117  DLCO corrected ml/min/mmHg 25.19  DLCO COR %Predicted % 112  DLVA Predicted % 133  TLC L 4.61  TLC % Predicted % 91  RV % Predicted % 126    Lab Results  Component Value Date   NITRICOXIDE 27 05/11/2020        Assessment & Plan:   Severe persistent asthma Severe persistent Asthma with allergic phenotype with significant improvement since starting Dupixent in 05/2020  On return visit if continues to do well consider stopping Singulair .  Plan  Patient Instructions  Continue on Trelegy inhaler 1 puff daily Continue on Dupixent injections Continue on Zyrtec 86m At bedtime   Chlorpheniramine (Chlor tab)  43mAt bedtime  As needed  Singular 10 mg daily Albuterol inhaler as needed Asthma action plan discussed Follow-up in 6 months with Dr. RaChase Callerr Leland Staszewski NP  Please contact office for sooner follow up if symptoms do not improve or worsen or seek emergency care       Allergic rhinitis Improved control on current regimen   Plan  Patient Instructions  Continue on Trelegy inhaler 1 puff daily Continue on Dupixent injections Continue on Zyrtec 101mt bedtime   Chlorpheniramine (Chlor tab)  4mg48m bedtime  As needed  Singular 10 mg daily Albuterol inhaler as needed Asthma action plan discussed Follow-up in 6 months with Dr. RamaChase CallerParrett NP  Please contact office for sooner follow up if symptoms do not improve or worsen or seek emergency care         TammRexene Edison 07/28/2021

## 2021-07-28 NOTE — Assessment & Plan Note (Signed)
Severe persistent Asthma with allergic phenotype with significant improvement since starting Dupixent in 05/2020  On return visit if continues to do well consider stopping Singulair .   Plan  Patient Instructions  Continue on Trelegy inhaler 1 puff daily Continue on Dupixent injections Continue on Zyrtec 33m At bedtime   Chlorpheniramine (Chlor tab)  466mAt bedtime  As needed  Singular 10 mg daily Albuterol inhaler as needed Asthma action plan discussed Follow-up in 6 months with Dr. RaChase Callerr Salem Lembke NP  Please contact office for sooner follow up if symptoms do not improve or worsen or seek emergency care

## 2021-07-28 NOTE — Patient Instructions (Signed)
Continue on Trelegy inhaler 1 puff daily Continue on Dupixent injections Continue on Zyrtec 28m At bedtime   Chlorpheniramine (Chlor tab)  447mAt bedtime  As needed  Singular 10 mg daily Albuterol inhaler as needed Asthma action plan discussed Follow-up in 6 months with Dr. RaChase Callerr Madyx Delfin NP  Please contact office for sooner follow up if symptoms do not improve or worsen or seek emergency care

## 2021-09-19 ENCOUNTER — Other Ambulatory Visit (HOSPITAL_COMMUNITY): Payer: Self-pay

## 2021-09-30 ENCOUNTER — Other Ambulatory Visit: Payer: Self-pay | Admitting: Adult Health

## 2021-09-30 DIAGNOSIS — J4550 Severe persistent asthma, uncomplicated: Secondary | ICD-10-CM

## 2021-10-04 NOTE — Telephone Encounter (Signed)
Refill sent for [place] to [facility]: [phone]  Dose: 300 mg SQ every 14 days  Last OV: 07/28/21 Provider: Rexene Edison, NP  Next OV: 01/26/22  Knox Saliva, PharmD, MPH, BCPS Clinical Pharmacist (Rheumatology and Pulmonology)

## 2022-01-25 ENCOUNTER — Ambulatory Visit: Payer: Self-pay | Admitting: Adult Health

## 2022-01-26 ENCOUNTER — Ambulatory Visit: Payer: 59 | Admitting: Adult Health

## 2022-03-07 ENCOUNTER — Other Ambulatory Visit: Payer: Self-pay | Admitting: Adult Health

## 2022-03-07 ENCOUNTER — Ambulatory Visit: Payer: BC Managed Care – PPO | Admitting: Adult Health

## 2022-03-07 ENCOUNTER — Encounter: Payer: Self-pay | Admitting: Adult Health

## 2022-03-07 DIAGNOSIS — J4550 Severe persistent asthma, uncomplicated: Secondary | ICD-10-CM | POA: Diagnosis not present

## 2022-03-07 DIAGNOSIS — J301 Allergic rhinitis due to pollen: Secondary | ICD-10-CM | POA: Diagnosis not present

## 2022-03-07 MED ORDER — ALBUTEROL SULFATE HFA 108 (90 BASE) MCG/ACT IN AERS: 2.0000 | INHALATION_SPRAY | Freq: Four times a day (QID) | RESPIRATORY_TRACT | 3 refills | Status: AC | PRN

## 2022-03-07 MED ORDER — CETIRIZINE HCL 10 MG PO TABS: 10.0000 mg | ORAL_TABLET | Freq: Every day | ORAL | 6 refills | Status: DC

## 2022-03-07 MED ORDER — LEVALBUTEROL HCL 1.25 MG/0.5ML IN NEBU: 1.2500 mg | INHALATION_SOLUTION | RESPIRATORY_TRACT | 3 refills | Status: AC | PRN

## 2022-03-07 MED ORDER — MONTELUKAST SODIUM 10 MG PO TABS: 10.0000 mg | ORAL_TABLET | Freq: Every day | ORAL | 6 refills | Status: DC

## 2022-03-07 MED ORDER — TRELEGY ELLIPTA 100-62.5-25 MCG/ACT IN AEPB: 1.0000 | INHALATION_SPRAY | Freq: Every day | RESPIRATORY_TRACT | 6 refills | Status: AC

## 2022-03-07 NOTE — Assessment & Plan Note (Signed)
Mild flare in symptoms.  Advised to restart Zyrtec.  May use chlor tab at bedtime if needed. ?Restart Singulair ? ?Plan  ?Patient Instructions  ?Restart your medications as below:  ?Continue on Trelegy inhaler 1 puff daily, rinse after use.  ?Continue on Dupixent injections (restart approval process)  ?Continue on Zyrtec 31m At bedtime   ?Chlorpheniramine (Chlor tab)  482mAt bedtime  As needed  ?Singular 10 mg daily ?Albuterol inhaler as needed ?Asthma action plan discussed ?Follow-up in 6 months with Dr. RaChase Callerr Deran Barro NP  ?Please contact office for sooner follow up if symptoms do not improve or worsen or seek emergency care  ? ?  ? ?

## 2022-03-07 NOTE — Assessment & Plan Note (Signed)
Severe persistent asthma with allergic phenotype.  Unfortunately patient has had a break in therapy due to lack of insurance.  Now her insurance has been reinstated.  Patient request prescriptions be sent to her pharmacy.  We will need to begin the [place] approval process with her new insurance. ?Asthma action plan discussed. ? ?Plan  ?Patient Instructions  ?Restart your medications as below:  ?Continue on Trelegy inhaler 1 puff daily, rinse after use.  ?Continue on Dupixent injections (restart approval process)  ?Continue on Zyrtec 83m At bedtime   ?Chlorpheniramine (Chlor tab)  434mAt bedtime  As needed  ?Singular 10 mg daily ?Albuterol inhaler as needed ?Asthma action plan discussed ?Follow-up in 6 months with Dr. RaChase Callerr Chesney Suares NP  ?Please contact office for sooner follow up if symptoms do not improve or worsen or seek emergency care  ? ?  ? ?

## 2022-03-07 NOTE — Progress Notes (Signed)
? ?@Patient  ID: Melissa Moon, female    DOB: Mar 05, 1986, 36 y.o.   MRN: [id number] ? ?Chief Complaint  ?Patient presents with  ? Follow-up  ? ? ?Referring provider: ?Marda Stalker, PA-C ? ?HPI: ?36 year old female never smoker seen for severe persistent asthma with allergic phenotype ? ?TEST/EVENTS :  ?03/29/2020-CBC with differential-eosinophils relative 12.7, eosinophils absolute 1.5 ?  ?03/29/2020-respiratory allergy panel-showing elevations to cockroach, [place], silver birch, oak, elm, pecan tree, [place] grass, Timothy grass, [place] grass, common ragweed, rough pigweed, IgE 133 ?>>>Highest elevations seen in [place], ragweed, Timothy grass ?  ?04/05/2020-CT chest high-res-no evidence of interstitial lung disease, air trapping is indicative of small airways disease, hepatic stenosis ?  ?67/20/2021-pulmonary function test-FVC 2.94 (78% predicted), postbronchodilator ratio 84, postbronchodilator FEV1 2.46 (77% predicted), no bronchodilator response, DLCO 26.35 (117% predicted) ? ?03/07/2022 Follow up :Allergic Asthma  ?Patient presents for 36-monthfollow-up.  Patient has underlying severe persistent asthma with an allergic phenotype.  Her maintenance regimen is Trelegy daily, Dupixent home injections  which was started August 2021.  She is on Singulair and Zyrtec. ?Patient says overall breathing is been doing well.  However she did lose her job in January of this year.  And has lost her insurance.  Has been off of her Trelegy and Singulair for the last few months.  She also has not been taking Dupixent.  She has recently gotten a new job with insurance.  Wants her prescriptions resent to the pharmacy.  ?Patient says that she has noticed over the last few days that she has had increased nasal congestion and stuffiness.  ?No increased albuterol use.  ? ? ?Allergies  ?Allergen Reactions  ? Cefaclor Other (See Comments)  ?  Happened as a child  ?Unsure of reaction ?  ? Tacrolimus Other (See Comments)  ?  Happened  as a child. unknown ?Other reaction(s): Other (See Comments) ?Increase blood glucose ?  ? Latex   ? ? ?Immunization History  ?Administered Date(s) Administered  ? Influenza-Unspecified 07/25/2021  ? PFIZER(Purple Top)SARS-COV-2 Vaccination 01/21/2020, 02/04/2020, 09/09/2020  ? Tdap 11/23/2020  ? ? ?Past Medical History:  ?Diagnosis Date  ? Allergies   ? Anxiety and depression   ? Asthma   ? GERD (gastroesophageal reflux disease)   ? Migraine   ? UC (ulcerative colitis) ([place]   ? ? ?Tobacco History: ?Social History  ? ?Tobacco Use  ?Smoking Status Never  ?Smokeless Tobacco Never  ? ?Counseling given: Not Answered ? ? ?Outpatient Medications Prior to Visit  ?Medication Sig Dispense Refill  ? Dupilumab (DUPIXENT) 300 MG/2ML SOPN INJECT 300MG SUBCUTANEOUSLY EVERY 14 DAYS 4 mL 4  ? EPINEPHrine 0.3 mg/0.3 mL IJ SOAJ injection Inject 0.3 mg into the muscle as needed for anaphylaxis. 1 each 1  ? Fluticasone-Umeclidin-Vilant (TRELEGY ELLIPTA) 100-62.5-25 MCG/INH AEPB Inhale 1 puff into the lungs daily. 60 each 3  ? cetirizine (ZYRTEC) 10 MG tablet Take 10 mg by mouth at bedtime.    ? levalbuterol (XOPENEX) 1.25 MG/0.5ML nebulizer solution Take 1.25 mg by nebulization every 4 (four) hours as needed for wheezing or shortness of breath.    ? montelukast (SINGULAIR) 10 MG tablet Take 10 mg by mouth at bedtime.    ? albuterol (PROVENTIL HFA;VENTOLIN HFA) 108 (90 Base) MCG/ACT inhaler Inhale 2 puffs into the lungs every 6 (six) hours as needed for wheezing or shortness of breath. (Patient not taking: Reported on 03/07/2022)    ? etonogestrel (NEXPLANON) 68 MG IMPL implant 1 each by Subdermal route  once.  (Patient not taking: Reported on 03/07/2022)    ? ?No facility-administered medications prior to visit.  ? ? ? ?Review of Systems:  ? ?Constitutional:   No  weight loss, night sweats,  Fevers, chills, fatigue, or  lassitude. ? ?HEENT:   No headaches,  Difficulty swallowing,  Tooth/dental problems, or  Sore throat,  ?              No  sneezing, itching, ear ache,  ?+nasal congestion, post nasal drip,  ? ?CV:  No chest pain,  Orthopnea, PND, swelling in lower extremities, anasarca, dizziness, palpitations, syncope.  ? ?GI  No heartburn, indigestion, abdominal pain, nausea, vomiting, diarrhea, change in bowel habits, loss of appetite, bloody stools.  ? ?Resp: No shortness of breath with exertion or at rest.  No excess mucus, no productive cough,  No non-productive cough,  No coughing up of blood.  No change in color of mucus.  No wheezing.  No chest wall deformity ? ?Skin: no rash or lesions. ? ?GU: no dysuria, change in color of urine, no urgency or frequency.  No flank pain, no hematuria  ? ?MS:  No joint pain or swelling.  No decreased range of motion.  No back pain. ? ? ? ?Physical Exam ? ?BP 106/80 (BP Location: Left Arm, Patient Position: Sitting, Cuff Size: Normal)   Pulse 77   Temp 98.3 ?F (36.8 ?C) (Oral)   Ht 5' 4"  (1.626 m)   Wt 176 lb 9.6 oz (80.1 kg)   SpO2 99%   BMI 30.31 kg/m?  ? ?GEN: A/Ox3; pleasant , NAD, well nourished  ?  ?HEENT:  [place]/AT,  NOSE-clear, THROAT-clear, no lesions, no postnasal drip or exudate noted.  ? ?NECK:  Supple w/ fair ROM; no JVD; normal carotid impulses w/o bruits; no thyromegaly or nodules palpated; no lymphadenopathy.   ? ?RESP  Clear  P & A; w/o, wheezes/ rales/ or rhonchi. no accessory muscle use, no dullness to percussion ? ?CARD:  RRR, no m/r/g, no peripheral edema, pulses intact, no cyanosis or clubbing. ? ?GI:   Soft & nt; nml bowel sounds; no organomegaly or masses detected.  ? ?Musco: Warm bil, no deformities or joint swelling noted.  ? ?Neuro: alert, no focal deficits noted.   ? ?Skin: Warm, no lesions or rashes ? ? ? ?Lab Results: ? ? ? ?BNP ?No results found for: BNP ? ?ProBNP ?No results found for: PROBNP ? ?Imaging: ?No results found. ? ? ? ? ?  Latest Ref Rng & Units 05/11/2020  ? 10:57 AM  ?PFT Results  ?FVC-Pre L 2.94    ?FVC-Predicted Pre % 78    ?FVC-Post L 2.91    ?FVC-Predicted Post  % 77    ?Pre FEV1/FVC % % 83    ?Post FEV1/FCV % % 84    ?FEV1-Pre L 2.46    ?FEV1-Predicted Pre % 77    ?FEV1-Post L 2.46    ?DLCO uncorrected ml/min/mmHg 26.35    ?DLCO UNC% % 117    ?DLCO corrected ml/min/mmHg 25.19    ?DLCO COR %Predicted % 112    ?DLVA Predicted % 133    ?TLC L 4.61    ?TLC % Predicted % 91    ?RV % Predicted % 126    ? ? ?Lab Results  ?Component Value Date  ? NITRICOXIDE 27 05/11/2020  ? ? ? ? ? ? ?Assessment & Plan:  ? ?Severe persistent asthma ?Severe persistent asthma with allergic phenotype.  Unfortunately patient  has had a break in therapy due to lack of insurance.  Now her insurance has been reinstated.  Patient request prescriptions be sent to her pharmacy.  We will need to begin the [place] approval process with her new insurance. ?Asthma action plan discussed. ? ?Plan  ?Patient Instructions  ?Restart your medications as below:  ?Continue on Trelegy inhaler 1 puff daily, rinse after use.  ?Continue on Dupixent injections (restart approval process)  ?Continue on Zyrtec 38m At bedtime   ?Chlorpheniramine (Chlor tab)  482mAt bedtime  As needed  ?Singular 10 mg daily ?Albuterol inhaler as needed ?Asthma action plan discussed ?Follow-up in 6 months with Dr. RaChase Callerr Nyeisha Goodall NP  ?Please contact office for sooner follow up if symptoms do not improve or worsen or seek emergency care  ? ?  ? ? ?Allergic rhinitis ?Mild flare in symptoms.  Advised to restart Zyrtec.  May use chlor tab at bedtime if needed. ?Restart Singulair ? ?Plan  ?Patient Instructions  ?Restart your medications as below:  ?Continue on Trelegy inhaler 1 puff daily, rinse after use.  ?Continue on Dupixent injections (restart approval process)  ?Continue on Zyrtec 1066mt bedtime   ?Chlorpheniramine (Chlor tab)  4mg69m bedtime  As needed  ?Singular 10 mg daily ?Albuterol inhaler as needed ?Asthma action plan discussed ?Follow-up in 6 months with Dr. RamaChase CallerParrett NP  ?Please contact office for sooner follow up if  symptoms do not improve or worsen or seek emergency care  ? ?  ? ? ? ? ?TammRexene Edison ?03/07/2022 ? ?

## 2022-03-07 NOTE — Patient Instructions (Addendum)
Restart your medications as below:  ?Continue on Trelegy inhaler 1 puff daily, rinse after use.  ?Continue on Dupixent injections (restart approval process)  ?Continue on Zyrtec 38m At bedtime   ?Chlorpheniramine (Chlor tab)  443mAt bedtime  As needed  ?Singular 10 mg daily ?Albuterol inhaler as needed ?Asthma action plan discussed ?Follow-up in 6 months with Dr. RaChase Callerr Darrian Grzelak NP  ?Please contact office for sooner follow up if symptoms do not improve or worsen or seek emergency care  ? ?

## 2022-03-09 ENCOUNTER — Telehealth: Payer: Self-pay | Admitting: Adult Health

## 2022-03-09 ENCOUNTER — Telehealth: Payer: Self-pay

## 2022-03-09 DIAGNOSIS — J4550 Severe persistent asthma, uncomplicated: Secondary | ICD-10-CM

## 2022-03-09 MED ORDER — MONTELUKAST SODIUM 10 MG PO TABS: 10.0000 mg | ORAL_TABLET | Freq: Every day | ORAL | 6 refills | Status: AC

## 2022-03-09 MED ORDER — CETIRIZINE HCL 10 MG PO TABS: 10.0000 mg | ORAL_TABLET | Freq: Every day | ORAL | 6 refills | Status: AC

## 2022-03-09 NOTE — Telephone Encounter (Signed)
Called and spoke with pt who stated when she went to go pick meds up from pharmacy, she noticed that the quantity for the singulair and zyrtec was 1 pill with 6 refills and not for a 30-day supply. Stated to pt that I would fix both Rx and she verbalized understanding. Nothing further needed.

## 2022-03-09 NOTE — Telephone Encounter (Signed)
Received New start paperwork for [name]. Pt was previously on [name] and had lapse in treatment due to lack of insurance coverage. Last dispense date was 09/12/21. TP would like pt to resume previous 366m dosed every 2 weeks. Will update as we work through the benefits process.  Submitted a Prior Authorization request to [place] [employer]. Will update once we receive a response.   Key: [id number]

## 2022-03-10 ENCOUNTER — Other Ambulatory Visit: Payer: Self-pay | Admitting: Adult Health

## 2022-03-10 NOTE — Telephone Encounter (Signed)
Received a fax regarding Prior Authorization from [facility] for [place]. Authorization has been DENIED because pt does not have FEV1 reversibility of at least 12 percent and 251m after bronchodilator (Pt has little to no response, see below).    Will submit appeal referencing chart notes from 05/12/2020 (pre-treatment) and 07/19/2020 (post-treatment) to prove previous clinical improvement as well as notes from 07/28/2021 to demonstrate that benefit was maintained for over a year while utilizing [place].  Appeal Fax# [phone] Reviewer Phone# [phone]

## 2022-03-10 NOTE — Telephone Encounter (Signed)
Albuterol solution is on back order.  What would you like to substitute?  Please advise.  Thank you.

## 2022-03-10 NOTE — Telephone Encounter (Signed)
Letter of Appeal received from [facility] and faxed in to [facility] Dept along with supporting chart notes. Will await response.

## 2022-03-14 NOTE — Telephone Encounter (Signed)
Please see note from [facility]

## 2022-03-27 DIAGNOSIS — Z0000 Encounter for general adult medical examination without abnormal findings: Secondary | ICD-10-CM | POA: Diagnosis not present

## 2022-03-27 DIAGNOSIS — Z23 Encounter for immunization: Secondary | ICD-10-CM | POA: Diagnosis not present

## 2022-03-30 ENCOUNTER — Other Ambulatory Visit (HOSPITAL_COMMUNITY): Payer: Self-pay

## 2022-03-30 NOTE — Telephone Encounter (Signed)
Called [employer] regarding appeal for [place]. Per rep, appeal was approved and requested approval letter be re-faxed.   Per test claim, patient must fill through [place]. Will await approval letter prior to sending prescription as test claim shows rejection stating prior auth required.   Rodolph Bong, PharmD Candidate 03/30/2022 3:05 PM

## 2022-04-03 ENCOUNTER — Other Ambulatory Visit (HOSPITAL_COMMUNITY): Payer: Self-pay

## 2022-04-05 MED ORDER — DUPIXENT 300 MG/2ML ~~LOC~~ SOAJ: 300.0000 mg | SUBCUTANEOUS | 1 refills | Status: DC

## 2022-04-05 NOTE — Telephone Encounter (Signed)
[facility] 377m SQ every 14 [facility]. Called patient to advise to resume one dose every 2 weeks. MyChart message sent with copay card information and pharmacy information  Dupixent copay card: BIN: [id number]: CN ID: [id number]: [id number] DKnox Saliva PharmD, MPH, BCPS, CPP Clinical Pharmacist (Rheumatology and Pulmonology)

## 2022-04-05 NOTE — Telephone Encounter (Signed)
Called [employer] regarding appeal for [place]. Per rep, appeal was cancelled due to an existing authorization existing on file. Requested the authorization letter on file be re-faxed. Expected arrival in 24-48 hours.  Authorization # [id number] Authorization dates: 12/23/21-indefinite Phone # [phone] Call reference # [id number]

## 2022-04-18 ENCOUNTER — Other Ambulatory Visit (HOSPITAL_COMMUNITY): Payer: Self-pay

## 2022-04-18 ENCOUNTER — Telehealth: Payer: Self-pay | Admitting: Adult Health

## 2022-04-18 DIAGNOSIS — J4550 Severe persistent asthma, uncomplicated: Secondary | ICD-10-CM

## 2022-04-18 NOTE — Telephone Encounter (Signed)
Received fax from [facility] stating prior authorization is required for Dupixent despite authorization being approved with start date of 02/24/22 (approval letter is on file). Rep states that they are unable to find authorization after looking for auth number. I did tell her that I have a copy of the letter but rep is unable to determine where approval letter originated. Approval letter has correct name, ID number, medication. Rep states to follow-up with new CMM  Phone: [phone], option 2, option 2  Submitted to [employer] via CMM to expedite response at this point.  Key: [id number]  Chesley Mires, PharmD, MPH, BCPS, CPP Clinical Pharmacist (Rheumatology and Pulmonology)

## 2022-04-21 NOTE — Telephone Encounter (Signed)
Submitted an URGENT appeal to  [place]  for [place].  Reference # [id number]  Appeal Fax# [phone] Clinical Reviewer Phone# [phone]  Knox Saliva, PharmD, MPH, BCPS, CPP Clinical Pharmacist (Rheumatology and Pulmonology)

## 2022-05-19 ENCOUNTER — Other Ambulatory Visit (HOSPITAL_COMMUNITY): Payer: Self-pay

## 2022-05-19 NOTE — Telephone Encounter (Signed)
[place] to determine if they are able to process Dupixent prescription. They were able to successfully adjudicate claim but her copay card has expired. Will need to enroll her into new copay card  Knox Saliva, PharmD, MPH, BCPS, CPP Clinical Pharmacist (Rheumatology and Pulmonology)

## 2022-05-26 NOTE — Telephone Encounter (Signed)
Spoke with [facility] to determine if patient has filled [place]. Per rep, copay card is still inactive. They tried to reach patient 05/19/22 but she never reached back out.  Spoke with patient - she states she has not yet had the chance to reach out to schedule shipment. Advised her to first reach out to copay card company to acquire accurate copay card information and then call pharmacy. She states she will plan to reach out today for the copay card information.  Will close encounter. Patient to reach out with any further concerns or issues  Knox Saliva, PharmD, MPH, BCPS, CPP Clinical Pharmacist (Rheumatology and Pulmonology)

## 2022-05-30 ENCOUNTER — Telehealth: Payer: Self-pay | Admitting: Adult Health

## 2022-05-31 NOTE — Telephone Encounter (Signed)
Routing to [name] for her to review. [name], please advise on this.

## 2022-05-31 NOTE — Telephone Encounter (Signed)
Devki is out of office until 8/16 and is unable to respond to clinical requests. Routing for assistance.

## 2022-05-31 NOTE — Telephone Encounter (Signed)
Collie Siad from [employer] on message for [place]. Collie Siad phone number is [phone].

## 2022-06-05 ENCOUNTER — Telehealth: Payer: Self-pay | Admitting: Adult Health

## 2022-06-05 NOTE — Telephone Encounter (Signed)
Patient states that she is needing the loading dose of Dupixent sent into her specialty pharmacy.   Please advise

## 2022-06-07 MED ORDER — DUPIXENT 300 MG/2ML ~~LOC~~ SOAJ: SUBCUTANEOUS | 0 refills | Status: DC

## 2022-06-07 NOTE — Addendum Note (Signed)
Addended by: Cassandria Anger on: 06/07/2022 12:40 PM   Modules accepted: Orders

## 2022-06-07 NOTE — Telephone Encounter (Signed)
Rx for Dupixent loading dose sent to [facility]. Patient was advised via MyChart  Knox Saliva, PharmD, MPH, BCPS, CPP Clinical Pharmacist (Rheumatology and Pulmonology)

## 2022-06-20 ENCOUNTER — Telehealth: Payer: Self-pay | Admitting: Adult Health

## 2022-06-20 DIAGNOSIS — J4550 Severe persistent asthma, uncomplicated: Secondary | ICD-10-CM

## 2022-06-22 ENCOUNTER — Other Ambulatory Visit (HOSPITAL_COMMUNITY): Payer: Self-pay

## 2022-06-22 ENCOUNTER — Telehealth: Payer: Self-pay | Admitting: Adult Health

## 2022-06-27 ENCOUNTER — Other Ambulatory Visit (HOSPITAL_COMMUNITY): Payer: Self-pay

## 2022-06-27 MED ORDER — DUPIXENT 300 MG/2ML ~~LOC~~ SOAJ: SUBCUTANEOUS | 0 refills | Status: DC

## 2022-06-27 NOTE — Telephone Encounter (Signed)
[facility] to advise them to fill maintenance dose of Dupixent. Per rep, rx was triaged [facility] as patient changed insurance as of 06/23/22.  Received CMM key via fax from [place]. Submitted clincals for initiation of treatment via Cover My Meds. When submitted, insurance populates as [facility] with patient and she states she never restarted Dupixent. She states that she was not aware of any insurance change either.  Key: [id number]  Case ID  #[id number]  Knox Saliva, PharmD, MPH, BCPS, CPP Clinical Pharmacist (Rheumatology and Pulmonology)

## 2022-06-27 NOTE — Telephone Encounter (Signed)
Received notification from [facility] regarding a prior authorization for [place]. Authorization has been APPROVED from 06/27/22 to 07/25/22.   Patient must fill through [facility]: [phone]  Authorization # [id number]  [place] to request they re-process rx. Rep states patient has to call to start process to fill rx. Called patient and advised that she do so. Provided [facility] with [facility] information and she confirms that she has collected copay card information to provide to pharmacy.  Knox Saliva, PharmD, MPH, BCPS, CPP Clinical Pharmacist (Rheumatology and Pulmonology)

## 2022-06-30 ENCOUNTER — Other Ambulatory Visit (HOSPITAL_COMMUNITY): Payer: Self-pay

## 2022-07-04 ENCOUNTER — Telehealth: Payer: Self-pay | Admitting: Adult Health

## 2022-07-07 ENCOUNTER — Other Ambulatory Visit (HOSPITAL_COMMUNITY): Payer: Self-pay

## 2022-07-07 NOTE — Telephone Encounter (Signed)
Returned call to [place] regarding [place] authorization.  Patient is part of [employer]. Per rep, patient received Dupixent on 07/05/22. Issue should be resolved   Spoke with patient - she receives two boxes of Dupixent on 07/05/22. She restarted Dupixent on 07/05/22 with loading dose. No  issues, injection site reactions. She confirms she wil move forward with one dose every 2 weeks moving forward.   Knox Saliva, PharmD, MPH, BCPS, CPP Clinical Pharmacist (Rheumatology and Pulmonology)

## 2022-07-10 ENCOUNTER — Other Ambulatory Visit: Payer: Self-pay | Admitting: Adult Health

## 2022-07-10 DIAGNOSIS — J4550 Severe persistent asthma, uncomplicated: Secondary | ICD-10-CM

## 2022-07-12 NOTE — Telephone Encounter (Signed)
Refill sent for DUPIXENT to  [facility]  Dose: 300 mg SQ every 2 weeks  Last OV: 03/07/22 Provider: Rexene Edison  Next OV: 09/07/22  Knox Saliva, PharmD, MPH, BCPS Clinical Pharmacist (Rheumatology and Pulmonology)

## 2022-07-31 NOTE — Telephone Encounter (Signed)
See encounter from 08/14.

## 2022-07-31 NOTE — Telephone Encounter (Signed)
See encounters from 08/29 and 08/31.

## 2022-09-07 ENCOUNTER — Ambulatory Visit: Payer: BC Managed Care – PPO | Admitting: Adult Health

## 2022-09-07 ENCOUNTER — Encounter: Payer: Self-pay | Admitting: Adult Health

## 2022-09-07 VITALS — BP 110/70 | HR 87 | Temp 97.5°F | Ht 64.0 in | Wt 191.4 lb

## 2022-09-07 DIAGNOSIS — Z23 Encounter for immunization: Secondary | ICD-10-CM | POA: Diagnosis not present

## 2022-09-07 DIAGNOSIS — J301 Allergic rhinitis due to pollen: Secondary | ICD-10-CM | POA: Diagnosis not present

## 2022-09-07 DIAGNOSIS — J4550 Severe persistent asthma, uncomplicated: Secondary | ICD-10-CM | POA: Diagnosis not present

## 2022-09-07 NOTE — Patient Instructions (Signed)
Continue on Trelegy inhaler 1 puff daily, rinse after use.  Continue on Dupixent injections Continue on Zyrtec 45m At bedtime   Chlorpheniramine (Chlor tab)  430mAt bedtime  As needed  Singular 10 mg daily Albuterol inhaler as needed Asthma action plan discussed Follow-up in 6 months with Dr. RaChase Callerr Hebert Dooling NP  Please contact office for sooner follow up if symptoms do not improve or worsen or seek emergency care

## 2022-09-07 NOTE — Assessment & Plan Note (Signed)
Severe persistent asthma with excellent control on maintenance regimen.  Patient seems to be doing very well.  We did discuss on return visit if still doing well with no flare and good control would consider de-escalating Trelegy down to possible Breo.  Continue on trigger prevention . Flu shot today  Plan  Patient Instructions  Continue on Trelegy inhaler 1 puff daily, rinse after use.  Continue on Dupixent injections Continue on Zyrtec 19m At bedtime   Chlorpheniramine (Chlor tab)  470mAt bedtime  As needed  Singular 10 mg daily Albuterol inhaler as needed Asthma action plan discussed Follow-up in 6 months with Dr. RaChase Callerr Dontay Harm NP  Please contact office for sooner follow up if symptoms do not improve or worsen or seek emergency care

## 2022-09-07 NOTE — Progress Notes (Signed)
@Patient  ID: Melissa Moon, female    DOB: 04-Jul-1986, 36 y.o.   MRN: [id number]  Chief Complaint  Patient presents with   Follow-up    Referring provider: Marda Stalker, PA-C  HPI: 36 year old female never smoker seen for severe persistent asthma with allergic phenotype Ulcerative colitis as child , colectomy age 51. (No active issues) .   TEST/EVENTS :  03/29/2020-CBC with differential-eosinophils relative 12.7, eosinophils absolute 1.5   03/29/2020-respiratory allergy panel-showing elevations to cockroach, [place], silver birch, oak, elm, pecan tree, [place] grass, Timothy grass, [place] grass, common ragweed, rough pigweed, IgE 133 >>>Highest elevations seen in [place], ragweed, Timothy grass   04/05/2020-CT chest high-res-no evidence of interstitial lung disease, air trapping is indicative of small airways disease, hepatic stenosis   67/20/2021-pulmonary function test-FVC 2.94 (78% predicted), postbronchodilator ratio 84, postbronchodilator FEV1 2.46 (77% predicted), no bronchodilator response, DLCO 26.35 (117% predicted)   09/07/2022 Follow up : Asthma  Patient presents for a 21-monthfollow-up.  Patient is treated for severe persistent asthma with allergic phenotype.  She remains on Trelegy daily, Dupixent home injections every 2 weeks and daily Singulair and Zyrtec.  Patient says she has been doing very well.  Last visit was restarted on her maintenance regimen as her insurance had changed.  She says since being back on her asthma medications she is feeling better.  Her asthma is under good control.  Has  had no albuterol use.  She has not had no recent flare and no prednisone in greater than 1 year.  She says she remains active.  Walks every day.  Nasal congestion and drainage are under good control. Would like to get flu shot today. History of tubal ligation with no plans for pregnancy.  Allergies  Allergen Reactions   Cefaclor Other (See Comments)    Happened as a  child  Unsure of reaction    Tacrolimus Other (See Comments)    Happened as a child. unknown Other reaction(s): Other (See Comments) Increase blood glucose    Latex     Immunization History  Administered Date(s) Administered   Influenza-Unspecified 07/25/2021   PFIZER(Purple Top)SARS-COV-2 Vaccination 01/21/2020, 02/04/2020, 09/09/2020   Tdap 11/23/2020    Past Medical History:  Diagnosis Date   Allergies    Anxiety and depression    Asthma    GERD (gastroesophageal reflux disease)    Migraine    UC (ulcerative colitis) ([place]     Tobacco History: Social History   Tobacco Use  Smoking Status Never  Smokeless Tobacco Never   Counseling given: Not Answered   Outpatient Medications Prior to Visit  Medication Sig Dispense Refill   albuterol (VENTOLIN HFA) 108 (90 Base) MCG/ACT inhaler Inhale 2 puffs into the lungs every 6 (six) hours as needed for wheezing or shortness of breath. 1 each 3   cetirizine (ZYRTEC) 10 MG tablet Take 1 tablet (10 mg total) by mouth at bedtime. 30 tablet 6   Dupilumab (DUPIXENT) 300 MG/2ML SOPN Inject 300 mg into the skin every 14 (fourteen) days. INJECT 1 PEN UNDER THE SKIN EVERY 14 DAYS 12 mL 1   EPINEPHrine 0.3 mg/0.3 mL IJ SOAJ injection Inject 0.3 mg into the muscle as needed for anaphylaxis. 1 each 1   Fluticasone-Umeclidin-Vilant (TRELEGY ELLIPTA) 100-62.5-25 MCG/ACT AEPB Inhale 1 puff into the lungs daily. 60 each 6   Fluticasone-Umeclidin-Vilant (TRELEGY ELLIPTA) 100-62.5-25 MCG/INH AEPB Inhale 1 puff into the lungs daily. 60 each 3   levalbuterol (XOPENEX) 1.25 MG/0.5ML nebulizer solution Take 1.25 mg  by nebulization every 4 (four) hours as needed for wheezing or shortness of breath. 60 each 3   montelukast (SINGULAIR) 10 MG tablet Take 1 tablet (10 mg total) by mouth at bedtime. 30 tablet 6   No facility-administered medications prior to visit.     Review of Systems:   Constitutional:   No  weight loss, night sweats,  Fevers,  chills, fatigue, or  lassitude.  HEENT:   No headaches,  Difficulty swallowing,  Tooth/dental problems, or  Sore throat,                No sneezing, itching, ear ache, nasal congestion, post nasal drip,   CV:  No chest pain,  Orthopnea, PND, swelling in lower extremities, anasarca, dizziness, palpitations, syncope.   GI  No heartburn, indigestion, abdominal pain, nausea, vomiting, diarrhea, change in bowel habits, loss of appetite, bloody stools.   Resp: No shortness of breath with exertion or at rest.  No excess mucus, no productive cough,  No non-productive cough,  No coughing up of blood.  No change in color of mucus.  No wheezing.  No chest wall deformity  Skin: no rash or lesions.  GU: no dysuria, change in color of urine, no urgency or frequency.  No flank pain, no hematuria   MS:  No joint pain or swelling.  No decreased range of motion.  No back pain.    Physical Exam  BP 110/70 (BP Location: Left Arm, Patient Position: Sitting, Cuff Size: Large)   Pulse 87   Temp (!) 97.5 F (36.4 C) (Oral)   Ht 5' 4"  (1.626 m)   Wt 191 lb 6.4 oz (86.8 kg)   SpO2 99%   BMI 32.85 kg/m   GEN: A/Ox3; pleasant , NAD, well nourished    HEENT:  [place]/AT,  NOSE-clear, THROAT-clear, no lesions, no postnasal drip or exudate noted.   NECK:  Supple w/ fair ROM; no JVD; normal carotid impulses w/o bruits; no thyromegaly or nodules palpated; no lymphadenopathy.    RESP  Clear  P & A; w/o, wheezes/ rales/ or rhonchi. no accessory muscle use, no dullness to percussion  CARD:  RRR, no m/r/g, no peripheral edema, pulses intact, no cyanosis or clubbing.  GI:   Soft & nt; nml bowel sounds; no organomegaly or masses detected.   Musco: Warm bil, no deformities or joint swelling noted.   Neuro: alert, no focal deficits noted.    Skin: Warm, no lesions or rashes    Lab Results:  CBC   BNP No results found for: "BNP"  ProBNP No results found for: "PROBNP"  Imaging: No results  found.       Latest Ref Rng & Units 05/11/2020   10:57 AM  PFT Results  FVC-Pre L 2.94   FVC-Predicted Pre % 78   FVC-Post L 2.91   FVC-Predicted Post % 77   Pre FEV1/FVC % % 83   Post FEV1/FCV % % 84   FEV1-Pre L 2.46   FEV1-Predicted Pre % 77   FEV1-Post L 2.46   DLCO uncorrected ml/min/mmHg 26.35   DLCO UNC% % 117   DLCO corrected ml/min/mmHg 25.19   DLCO COR %Predicted % 112   DLVA Predicted % 133   TLC L 4.61   TLC % Predicted % 91   RV % Predicted % 126     Lab Results  Component Value Date   NITRICOXIDE 27 05/11/2020        Assessment & Plan:   No  problem-specific Assessment & Plan notes found for this encounter.     Rexene Edison, NP 09/07/2022

## 2022-09-08 NOTE — Assessment & Plan Note (Signed)
Continue on current maintenance regimen add in saline nasal spray and gel

## 2022-12-25 ENCOUNTER — Other Ambulatory Visit: Payer: Self-pay | Admitting: Adult Health

## 2022-12-25 DIAGNOSIS — J4550 Severe persistent asthma, uncomplicated: Secondary | ICD-10-CM

## 2022-12-26 NOTE — Telephone Encounter (Signed)
Refill [facility] for [place] [facility]: [phone]  Dose: 300 mg SQ every 2 weeks  Last OV: 09/07/22 Provider: Rexene Edison, NP  Next OV: 6 months, not yet scheduled  Knox Saliva, PharmD, MPH, BCPS Clinical Pharmacist (Rheumatology and Pulmonology)

## 2023-02-01 ENCOUNTER — Telehealth: Payer: Self-pay | Admitting: Pharmacist

## 2023-02-01 DIAGNOSIS — J4550 Severe persistent asthma, uncomplicated: Secondary | ICD-10-CM

## 2023-02-01 NOTE — Telephone Encounter (Signed)
Patient has change in insurance active as of 01/22/23. Submitted a Prior Authorization request to [employer] for DUPIXENT via CoverMyMeds. Will update once we receive a response.  Key: [id number]

## 2023-02-06 ENCOUNTER — Other Ambulatory Visit (HOSPITAL_COMMUNITY): Payer: Self-pay

## 2023-02-06 MED ORDER — DUPIXENT 300 MG/2ML ~~LOC~~ SOAJ: SUBCUTANEOUS | 0 refills | Status: AC

## 2023-02-06 NOTE — Telephone Encounter (Signed)
Received notification from [employer] regarding a prior authorization for DUPIXENT. Authorization has been APPROVED from 01/08/23 to 08/02/23.   Unable to run test claim because patient must fill through [facility]: [phone]  Authorization # [id number]  Rx for Dupixent snet to [facility] today. Message sent to pt via MyChart with update and pharmacy phone number  Chesley Mires, PharmD, MPH, BCPS, CPP Clinical Pharmacist (Rheumatology and Pulmonology)

## 2023-05-21 ENCOUNTER — Telehealth: Payer: Self-pay | Admitting: Pharmacist

## 2023-05-21 NOTE — Telephone Encounter (Signed)
Patient due for Dupixent r/f but is overdue for fu appt. Routing to scheduling team

## 2023-05-22 ENCOUNTER — Telehealth: Payer: Self-pay | Admitting: Internal Medicine

## 2023-05-22 NOTE — Telephone Encounter (Signed)
Pt. Was made apt with Np but she has no insurance till Oct . Needs ass. Program

## 2023-05-23 NOTE — Telephone Encounter (Signed)
Attempted to call pt to discuss, left VoiceMail requesting a return call. Direct office number provided.

## 2023-06-06 ENCOUNTER — Other Ambulatory Visit (HOSPITAL_COMMUNITY): Payer: Self-pay

## 2023-06-06 NOTE — Telephone Encounter (Signed)
MyChart message sent to patient.  Chesley Mires, PharmD, MPH, BCPS, CPP Clinical Pharmacist (Rheumatology and Pulmonology)

## 2023-06-22 ENCOUNTER — Ambulatory Visit: Payer: BC Managed Care – PPO | Admitting: Adult Health

## 2023-06-27 NOTE — Telephone Encounter (Signed)
Patient no-show to appt on 06/22/23. Closing f/u on this due to lack of f/u from patient to phone calls

## 2023-08-27 ENCOUNTER — Encounter (HOSPITAL_BASED_OUTPATIENT_CLINIC_OR_DEPARTMENT_OTHER): Payer: Self-pay

## 2023-08-31 DIAGNOSIS — Z3202 Encounter for pregnancy test, result negative: Secondary | ICD-10-CM | POA: Diagnosis not present

## 2023-09-25 ENCOUNTER — Ambulatory Visit: Payer: Self-pay | Admitting: Adult Health

## 2023-10-16 ENCOUNTER — Ambulatory Visit: Payer: Self-pay | Admitting: Adult Health

## 2023-11-06 ENCOUNTER — Ambulatory Visit: Payer: Self-pay | Admitting: Adult Health

## 2023-12-20 ENCOUNTER — Ambulatory Visit: Payer: Self-pay | Admitting: Adult Health

## 2024-01-11 ENCOUNTER — Ambulatory Visit: Payer: Self-pay | Admitting: Adult Health

## 2024-05-05 DIAGNOSIS — R062 Wheezing: Secondary | ICD-10-CM | POA: Diagnosis not present

## 2024-05-05 DIAGNOSIS — R0981 Nasal congestion: Secondary | ICD-10-CM | POA: Diagnosis not present

## 2024-05-05 DIAGNOSIS — R051 Acute cough: Secondary | ICD-10-CM | POA: Diagnosis not present

## 2024-05-20 NOTE — Progress Notes (Signed)
 This encounter was created in error - please disregard.

## 2024-06-19 DIAGNOSIS — R051 Acute cough: Secondary | ICD-10-CM | POA: Diagnosis not present

## 2024-06-19 DIAGNOSIS — J4541 Moderate persistent asthma with (acute) exacerbation: Secondary | ICD-10-CM | POA: Diagnosis not present

## 2024-07-17 ENCOUNTER — Ambulatory Visit: Admitting: Adult Health

## 2024-07-22 DIAGNOSIS — R059 Cough, unspecified: Secondary | ICD-10-CM | POA: Diagnosis not present

## 2024-07-22 DIAGNOSIS — R0602 Shortness of breath: Secondary | ICD-10-CM | POA: Diagnosis not present

## 2024-07-22 DIAGNOSIS — J4540 Moderate persistent asthma, uncomplicated: Secondary | ICD-10-CM | POA: Diagnosis not present

## 2024-09-11 DIAGNOSIS — Z114 Encounter for screening for human immunodeficiency virus [HIV]: Secondary | ICD-10-CM | POA: Diagnosis not present

## 2024-09-11 DIAGNOSIS — Z113 Encounter for screening for infections with a predominantly sexual mode of transmission: Secondary | ICD-10-CM | POA: Diagnosis not present
